# Patient Record
Sex: Male | Born: 1965 | Race: White | Hispanic: No | Marital: Married | Smoking: Current every day smoker
Health system: Southern US, Community
[De-identification: ages and names within clinical notes are randomized; demographics above are authoritative.]

## PROBLEM LIST (undated history)

## (undated) DIAGNOSIS — K759 Inflammatory liver disease, unspecified: Secondary | ICD-10-CM

## (undated) DIAGNOSIS — K219 Gastro-esophageal reflux disease without esophagitis: Secondary | ICD-10-CM

## (undated) DIAGNOSIS — K469 Unspecified abdominal hernia without obstruction or gangrene: Secondary | ICD-10-CM

## (undated) DIAGNOSIS — K4090 Unilateral inguinal hernia, without obstruction or gangrene, not specified as recurrent: Secondary | ICD-10-CM

## (undated) DIAGNOSIS — C801 Malignant (primary) neoplasm, unspecified: Secondary | ICD-10-CM

## (undated) DIAGNOSIS — I10 Essential (primary) hypertension: Secondary | ICD-10-CM

## (undated) HISTORY — PX: NEPHRECTOMY: SHX65

## (undated) HISTORY — PX: CHOLECYSTECTOMY: SHX55

## (undated) HISTORY — PX: HERNIA REPAIR: SHX51

---

## 1998-11-20 ENCOUNTER — Emergency Department (HOSPITAL_COMMUNITY): Admission: EM | Admit: 1998-11-20 | Discharge: 1998-11-20 | Payer: Self-pay | Admitting: Emergency Medicine

## 2016-12-27 ENCOUNTER — Ambulatory Visit: Payer: Self-pay | Admitting: Surgery

## 2016-12-27 NOTE — H&P (Signed)
Theodore Phelps is an 51 y.o. male.   Chief Complaint: left inguinal hernia  HPI: Pt presents for repair of a large left inguinoscrotal hernia.  He has had this for 6 months.  It pops in and out and has been getting larger and causing pain in his left groin.   It is more painful when he lifts.  No change in bowel or bladder function.    No past medical history on file.  No past surgical history on file.  No family history on file. Social History:  has no tobacco, alcohol, and drug history on file.  Allergies: Allergies not on file   (Not in a hospital admission)  No results found for this or any previous visit (from the past 48 hour(s)). No results found.  Review of Systems  Constitutional: Negative for chills and fever.  HENT: Negative for hearing loss and tinnitus.   Eyes: Negative for blurred vision and double vision.  Respiratory: Positive for cough and wheezing.   Cardiovascular: Negative for chest pain.  Gastrointestinal: Positive for abdominal pain.  Genitourinary: Negative for dysuria and urgency.  Musculoskeletal: Negative for myalgias and neck pain.  Skin: Negative for itching and rash.  Neurological: Negative for dizziness and headaches.  Endo/Heme/Allergies: Negative for environmental allergies. Does not bruise/bleed easily.  Psychiatric/Behavioral: Negative for depression.    There were no vitals taken for this visit. Physical Exam  Constitutional: He is oriented to person, place, and time. He appears well-developed and well-nourished.  HENT:  Head: Normocephalic and atraumatic.  Eyes: Pupils are equal, round, and reactive to light. No scleral icterus.  Neck: Normal range of motion. Neck supple.  Cardiovascular: Normal rate and regular rhythm.   Respiratory: Effort normal and breath sounds normal. He has no wheezes.  GI: Soft. Bowel sounds are normal.  Genitourinary:  Genitourinary Comments: Large reducible left inguinoscrotal hernia No RIH  Testes small    Musculoskeletal: Normal range of motion.  Neurological: He is alert and oriented to person, place, and time.  Skin: Skin is warm and dry.  Psychiatric: He has a normal mood and affect. His behavior is normal.     Assessment/Plan Large left inguinoscrotal hernia   Tobacco abuse       Pt desires repair  Laparoscopic and open  Procedure discussed   Use of mesh discussed      The risk of hernia repair include bleeding,  Infection,   Recurrence of the hernia,  Mesh use, chronic pain,  Organ injury,  Bowel injury,  Bladder injury,   nerve injury with numbness around the incision,  Death,  and worsening of preexisting  medical problems.  The alternatives to surgery have been discussed as well..  Long term expectations of both operative and non operative treatments have been discussed.   The patient agrees to proceed.  Theodore Phelps A., MD 12/27/2016, 9:26 AM

## 2017-01-14 ENCOUNTER — Encounter (HOSPITAL_COMMUNITY): Payer: Self-pay

## 2017-01-14 ENCOUNTER — Encounter (HOSPITAL_COMMUNITY)
Admission: RE | Admit: 2017-01-14 | Discharge: 2017-01-14 | Disposition: A | Discharge status: Home / Self Care | Payer: PRIVATE HEALTH INSURANCE | Source: Ambulatory Visit | Attending: Surgery | Admitting: Surgery

## 2017-01-14 DIAGNOSIS — Z85528 Personal history of other malignant neoplasm of kidney: Secondary | ICD-10-CM | POA: Diagnosis not present

## 2017-01-14 DIAGNOSIS — Z01810 Encounter for preprocedural cardiovascular examination: Secondary | ICD-10-CM | POA: Insufficient documentation

## 2017-01-14 DIAGNOSIS — K469 Unspecified abdominal hernia without obstruction or gangrene: Secondary | ICD-10-CM | POA: Insufficient documentation

## 2017-01-14 DIAGNOSIS — K4090 Unilateral inguinal hernia, without obstruction or gangrene, not specified as recurrent: Secondary | ICD-10-CM | POA: Insufficient documentation

## 2017-01-14 DIAGNOSIS — I10 Essential (primary) hypertension: Secondary | ICD-10-CM | POA: Diagnosis not present

## 2017-01-14 DIAGNOSIS — Z01812 Encounter for preprocedural laboratory examination: Secondary | ICD-10-CM | POA: Diagnosis present

## 2017-01-14 DIAGNOSIS — I4510 Unspecified right bundle-branch block: Secondary | ICD-10-CM | POA: Insufficient documentation

## 2017-01-14 HISTORY — DX: Unilateral inguinal hernia, without obstruction or gangrene, not specified as recurrent: K40.90

## 2017-01-14 HISTORY — DX: Essential (primary) hypertension: I10

## 2017-01-14 HISTORY — DX: Unspecified abdominal hernia without obstruction or gangrene: K46.9

## 2017-01-14 HISTORY — DX: Malignant (primary) neoplasm, unspecified: C80.1

## 2017-01-14 HISTORY — DX: Gastro-esophageal reflux disease without esophagitis: K21.9

## 2017-01-14 HISTORY — DX: Inflammatory liver disease, unspecified: K75.9

## 2017-01-14 LAB — COMPREHENSIVE METABOLIC PANEL
ALBUMIN: 3.7 g/dL (ref 3.5–5.0)
ALK PHOS: 65 U/L (ref 38–126)
ALT: 22 U/L (ref 17–63)
AST: 20 U/L (ref 15–41)
Anion gap: 8 (ref 5–15)
BUN: 17 mg/dL (ref 6–20)
CHLORIDE: 108 mmol/L (ref 101–111)
CO2: 22 mmol/L (ref 22–32)
CREATININE: 1.09 mg/dL (ref 0.61–1.24)
Calcium: 9.5 mg/dL (ref 8.9–10.3)
GFR calc Af Amer: >60 mL/min (ref >60)
GFR calc non Af Amer: >60 mL/min (ref >60)
GLUCOSE: 92 mg/dL (ref 65–99)
Potassium: 4.7 mmol/L (ref 3.5–5.1)
SODIUM: 138 mmol/L (ref 135–145)
TOTAL PROTEIN: 6.9 g/dL (ref 6.5–8.1)
Total Bilirubin: 0.5 mg/dL (ref 0.3–1.2)

## 2017-01-14 LAB — CBC WITH DIFFERENTIAL/PLATELET
BASOS ABS: 0 10*3/uL (ref 0.0–0.1)
Basophils Relative: 0 %
EOS ABS: 0.1 10*3/uL (ref 0.0–0.7)
EOS PCT: 1 %
HCT: 46.6 % (ref 39.0–52.0)
HEMOGLOBIN: 15.2 g/dL (ref 13.0–17.0)
Lymphocytes Relative: 31 %
Lymphs Abs: 3.3 10*3/uL (ref 0.7–4.0)
MCH: 28.7 pg (ref 26.0–34.0)
MCHC: 32.6 g/dL (ref 30.0–36.0)
MCV: 88.1 fL (ref 78.0–100.0)
Monocytes Absolute: 0.6 10*3/uL (ref 0.1–1.0)
Monocytes Relative: 5 %
NEUTROS PCT: 63 %
Neutro Abs: 6.6 10*3/uL (ref 1.7–7.7)
PLATELETS: 291 10*3/uL (ref 150–400)
RBC: 5.29 MIL/uL (ref 4.22–5.81)
RDW: 15.2 % (ref 11.5–15.5)
WBC: 10.7 10*3/uL — AB (ref 4.0–10.5)

## 2017-01-14 NOTE — Progress Notes (Signed)
PCP - Dr. Jeanie Sewer- [place]  Cardiologist - Denies  Chest x-ray - Denies  EKG - 01/14/17  Stress Test - Requested  ECHO - Denies  Cardiac Cath - Denies  Sleep Study - No CPAP - None  Chart will be given for anesthesia review due to cardiac history and EKG.  Pt denies having chest pain, sob, or fever at this time. All instructions explained to the pt, with a verbal understanding of the material. Pt agrees to go over the instructions while at home for a better understanding. The opportunity to ask questions was provided.

## 2017-01-14 NOTE — Pre-Procedure Instructions (Signed)
Theodore Phelps  01/14/2017      [facility] - [place], [place] - [place] [address] Phone: [phone] Fax: [phone]    Your procedure is scheduled on Wednesday, January 19, 2017  Report to [facility] Admitting Entrance "A" at 8:30 A.M.   Call this number if you have problems the morning of surgery:  [phone]   Remember:  Do not eat food or drink liquids after midnight.  Take these medicines the morning of surgery with A SIP OF WATER:  AmLODipine (NORVASC)  As of today, stop taking all Aspirins, Vitamins, Fish oils, and Herbal medications. Also stop all NSAIDS i.e. Advil, Motrin, Aleve, Anaprox, Naproxen, BC and Goody Powders.  Please complete your 8oz of Boost Breeze that was given to you at your preadmission appointment by 6:30am on the day of your surgery.   Do not wear jewelry.  Do not wear lotions, powders, or perfumes, or deodorant.  Do not shave 48 hours prior to surgery.  Men may shave face and neck.  Do not bring valuables to the hospital.  [facility] is not responsible for any belongings or valuables.  Contacts, dentures or bridgework may not be worn into surgery.  Leave your suitcase in the car.  After surgery it may be brought to your room.  For patients admitted to the hospital, discharge time will be determined by your treatment team.  Patients discharged the day of surgery will not be allowed to drive home.   Special instructions:   [facility]- Preparing For Surgery  Before surgery, you can play an important role. Because skin is not sterile, your skin needs to be as free of germs as possible. You can reduce the number of germs on your skin by washing with CHG (chlorahexidine gluconate) Soap before surgery.  CHG is an antiseptic cleaner which kills germs and bonds with the skin to continue killing germs even after washing.  Please do not use if you have an allergy to CHG or antibacterial soaps.  If your skin becomes reddened/irritated stop using the CHG.  Do not shave (including legs and underarms) for at least 48 hours prior to first CHG shower. It is OK to shave your face.  Please follow these instructions carefully.   1. Shower the NIGHT BEFORE SURGERY and the MORNING OF SURGERY with CHG.   2. If you chose to wash your hair, wash your hair first as usual with your normal shampoo.  3. After you shampoo, rinse your hair and body thoroughly to remove the shampoo.  4. Use CHG as you would any other liquid soap. You can apply CHG directly to the skin and wash gently with a scrungie or a clean washcloth.   5. Apply the CHG Soap to your body ONLY FROM THE NECK DOWN.  Do not use on open wounds or open sores. Avoid contact with your eyes, ears, mouth and genitals (private parts). Wash genitals (private parts) with your normal soap.  6. Wash thoroughly, paying special attention to the area where your surgery will be performed.  7. Thoroughly rinse your body with warm water from the neck down.  8. DO NOT shower/wash with your normal soap after using and rinsing off the CHG Soap.  9. Pat yourself dry with a CLEAN TOWEL.   10. Wear CLEAN PAJAMAS   11. Place CLEAN SHEETS on your bed the night of your first shower and DO NOT SLEEP  WITH PETS.  Day of Surgery: Do not apply any deodorants/lotions. Please wear clean clothes to the hospital/surgery center.    Please read over the following fact sheets that you were given. Pain Booklet, Coughing and Deep Breathing and Surgical Site Infection Prevention

## 2017-01-17 NOTE — Progress Notes (Addendum)
Anesthesia Chart Review:  Pt is a 51 year old male scheduled for L inguinal hernia repair, insertion of mesh on 01/19/2017 with Erroll Luna, MD  - PCP is Jeanie Sewer, NP  PMH includes:  HTN, renal cancer (s/p L nephrectomy), hepatitis C, GERD. Current smoker. Opium and crack cocaine use. BMI 29  Medications include: amlodipine  Preoperative labs reviewed.    BP (!) 135/95   Pulse 85   Temp 36.8 C   Resp 20   Ht 5\' 8"  (1.727 m)   Wt 188 lb 14.4 oz (85.7 kg)   SpO2 97%   BMI 28.72 kg/m   EKG 01/14/17: NSR. RBBB.   Stress echo 07/28/13 [facility]): 1. No ECG or echocardiographic evidence of inducible ischemia to achieved workload (7.0 METS)  If no changes, I anticipate pt can proceed with surgery as scheduled.   Willeen Cass, FNP-BC [facility] Short Stay Surgical Center/Anesthesiology Phone: [phone] 01/17/2017 10:15 AM

## 2017-01-18 MED ORDER — CEFAZOLIN SODIUM-DEXTROSE 2-4 GM/100ML-% IV SOLN
2.0000 g | INTRAVENOUS | Status: AC
  Administered 2017-01-19: 2 g via INTRAVENOUS
  Filled 2017-01-18: qty 100

## 2017-01-19 ENCOUNTER — Encounter (HOSPITAL_COMMUNITY)
Admission: RE | Disposition: A | Discharge status: Home / Self Care | Payer: Self-pay | Source: Ambulatory Visit | Attending: Surgery

## 2017-01-19 ENCOUNTER — Ambulatory Visit (HOSPITAL_COMMUNITY): Payer: PRIVATE HEALTH INSURANCE | Admitting: Emergency Medicine

## 2017-01-19 ENCOUNTER — Ambulatory Visit (HOSPITAL_COMMUNITY)
Admission: RE | Admit: 2017-01-19 | Discharge: 2017-01-19 | Disposition: A | Discharge status: Home / Self Care | Payer: PRIVATE HEALTH INSURANCE | Source: Ambulatory Visit | Attending: Surgery | Admitting: Surgery

## 2017-01-19 ENCOUNTER — Encounter (HOSPITAL_COMMUNITY): Payer: Self-pay | Admitting: Certified Registered Nurse Anesthetist

## 2017-01-19 ENCOUNTER — Ambulatory Visit (HOSPITAL_COMMUNITY): Payer: PRIVATE HEALTH INSURANCE | Admitting: Certified Registered Nurse Anesthetist

## 2017-01-19 DIAGNOSIS — K4090 Unilateral inguinal hernia, without obstruction or gangrene, not specified as recurrent: Secondary | ICD-10-CM | POA: Insufficient documentation

## 2017-01-19 DIAGNOSIS — B1920 Unspecified viral hepatitis C without hepatic coma: Secondary | ICD-10-CM | POA: Diagnosis not present

## 2017-01-19 DIAGNOSIS — F17200 Nicotine dependence, unspecified, uncomplicated: Secondary | ICD-10-CM | POA: Insufficient documentation

## 2017-01-19 DIAGNOSIS — K219 Gastro-esophageal reflux disease without esophagitis: Secondary | ICD-10-CM | POA: Insufficient documentation

## 2017-01-19 DIAGNOSIS — Z79899 Other long term (current) drug therapy: Secondary | ICD-10-CM | POA: Diagnosis not present

## 2017-01-19 DIAGNOSIS — I10 Essential (primary) hypertension: Secondary | ICD-10-CM | POA: Diagnosis not present

## 2017-01-19 HISTORY — PX: INGUINAL HERNIA REPAIR: SHX194

## 2017-01-19 HISTORY — PX: INSERTION OF MESH: SHX5868

## 2017-01-19 SURGERY — REPAIR, HERNIA, INGUINAL, ADULT
Anesthesia: General | Site: Groin | Laterality: Left

## 2017-01-19 MED ORDER — LACTATED RINGERS IV SOLN: INTRAVENOUS | Status: DC

## 2017-01-19 MED ORDER — PROPOFOL 10 MG/ML IV BOLUS
INTRAVENOUS | Status: DC | PRN
  Administered 2017-01-19: 200 mg via INTRAVENOUS

## 2017-01-19 MED ORDER — FENTANYL CITRATE (PF) 100 MCG/2ML IJ SOLN
INTRAMUSCULAR | Status: AC
  Administered 2017-01-19: 50 ug via INTRAVENOUS
  Filled 2017-01-19: qty 2

## 2017-01-19 MED ORDER — EPHEDRINE 5 MG/ML INJ
INTRAVENOUS | Status: AC
  Filled 2017-01-19: qty 10

## 2017-01-19 MED ORDER — FENTANYL CITRATE (PF) 250 MCG/5ML IJ SOLN
INTRAMUSCULAR | Status: AC
  Filled 2017-01-19: qty 5

## 2017-01-19 MED ORDER — OXYCODONE HCL 5 MG PO TABS
10.0000 mg | ORAL_TABLET | Freq: Once | ORAL | Status: AC
  Administered 2017-01-19: 10 mg via ORAL

## 2017-01-19 MED ORDER — HYDROMORPHONE HCL 1 MG/ML IJ SOLN
INTRAMUSCULAR | Status: AC
  Administered 2017-01-19: 0.5 mg via INTRAVENOUS
  Filled 2017-01-19: qty 1

## 2017-01-19 MED ORDER — BUPIVACAINE-EPINEPHRINE (PF) 0.5% -1:200000 IJ SOLN
INTRAMUSCULAR | Status: DC | PRN
  Administered 2017-01-19: 30 mL

## 2017-01-19 MED ORDER — MEPERIDINE HCL 25 MG/ML IJ SOLN: 6.2500 mg | INTRAMUSCULAR | Status: DC | PRN

## 2017-01-19 MED ORDER — EPHEDRINE SULFATE 50 MG/ML IJ SOLN
INTRAMUSCULAR | Status: DC | PRN
  Administered 2017-01-19: 20 mg via INTRAVENOUS

## 2017-01-19 MED ORDER — PHENYLEPHRINE 40 MCG/ML (10ML) SYRINGE FOR IV PUSH (FOR BLOOD PRESSURE SUPPORT)
PREFILLED_SYRINGE | INTRAVENOUS | Status: DC | PRN
  Administered 2017-01-19: 160 ug via INTRAVENOUS
  Administered 2017-01-19: 80 ug via INTRAVENOUS

## 2017-01-19 MED ORDER — 0.9 % SODIUM CHLORIDE (POUR BTL) OPTIME
TOPICAL | Status: DC | PRN
  Administered 2017-01-19: 1000 mL

## 2017-01-19 MED ORDER — OXYCODONE HCL 5 MG PO TABS: 5.0000 mg | ORAL_TABLET | Freq: Four times a day (QID) | ORAL | 0 refills | Status: DC | PRN

## 2017-01-19 MED ORDER — LACTATED RINGERS IV SOLN
INTRAVENOUS | Status: DC
Start: 2017-01-19 — End: 2017-01-19
  Administered 2017-01-19 (×2): via INTRAVENOUS

## 2017-01-19 MED ORDER — OXYCODONE HCL 5 MG PO TABS
ORAL_TABLET | ORAL | Status: AC
  Administered 2017-01-19: 10 mg via ORAL
  Filled 2017-01-19: qty 2

## 2017-01-19 MED ORDER — MIDAZOLAM HCL 2 MG/2ML IJ SOLN
2.0000 mg | Freq: Once | INTRAMUSCULAR | Status: AC
  Administered 2017-01-19: 2 mg via INTRAVENOUS

## 2017-01-19 MED ORDER — ACETAMINOPHEN 500 MG PO TABS
ORAL_TABLET | ORAL | Status: AC
  Filled 2017-01-19: qty 2

## 2017-01-19 MED ORDER — ACETAMINOPHEN 500 MG PO TABS
1000.0000 mg | ORAL_TABLET | ORAL | Status: AC
  Administered 2017-01-19: 1000 mg via ORAL

## 2017-01-19 MED ORDER — FENTANYL CITRATE (PF) 100 MCG/2ML IJ SOLN
INTRAMUSCULAR | Status: DC | PRN
  Administered 2017-01-19 (×2): 50 ug via INTRAVENOUS
  Administered 2017-01-19: 100 ug via INTRAVENOUS

## 2017-01-19 MED ORDER — FENTANYL CITRATE (PF) 100 MCG/2ML IJ SOLN
50.0000 ug | Freq: Once | INTRAMUSCULAR | Status: AC
  Administered 2017-01-19: 50 ug via INTRAVENOUS

## 2017-01-19 MED ORDER — GABAPENTIN 300 MG PO CAPS
ORAL_CAPSULE | ORAL | Status: AC
  Filled 2017-01-19: qty 1

## 2017-01-19 MED ORDER — ROCURONIUM BROMIDE 100 MG/10ML IV SOLN
INTRAVENOUS | Status: DC | PRN
  Administered 2017-01-19: 10 mg via INTRAVENOUS
  Administered 2017-01-19: 40 mg via INTRAVENOUS

## 2017-01-19 MED ORDER — ONDANSETRON HCL 4 MG/2ML IJ SOLN
INTRAMUSCULAR | Status: AC
  Filled 2017-01-19: qty 2

## 2017-01-19 MED ORDER — ONDANSETRON HCL 4 MG/2ML IJ SOLN
INTRAMUSCULAR | Status: DC | PRN
  Administered 2017-01-19: 4 mg via INTRAVENOUS

## 2017-01-19 MED ORDER — CELECOXIB 200 MG PO CAPS
ORAL_CAPSULE | ORAL | Status: AC
  Filled 2017-01-19: qty 1

## 2017-01-19 MED ORDER — HYDROMORPHONE HCL 1 MG/ML IJ SOLN
0.2500 mg | INTRAMUSCULAR | Status: DC | PRN
  Administered 2017-01-19 (×3): 0.5 mg via INTRAVENOUS

## 2017-01-19 MED ORDER — PROPOFOL 10 MG/ML IV BOLUS
INTRAVENOUS | Status: AC
  Filled 2017-01-19: qty 20

## 2017-01-19 MED ORDER — ETOMIDATE 2 MG/ML IV SOLN
INTRAVENOUS | Status: AC
  Filled 2017-01-19: qty 10

## 2017-01-19 MED ORDER — DEXAMETHASONE SODIUM PHOSPHATE 10 MG/ML IJ SOLN
INTRAMUSCULAR | Status: DC | PRN
  Administered 2017-01-19: 10 mg via INTRAVENOUS

## 2017-01-19 MED ORDER — IBUPROFEN 800 MG PO TABS: 800.0000 mg | ORAL_TABLET | Freq: Three times a day (TID) | ORAL | 0 refills | Status: DC | PRN

## 2017-01-19 MED ORDER — PHENYLEPHRINE HCL 10 MG/ML IJ SOLN
INTRAVENOUS | Status: DC | PRN
  Administered 2017-01-19: 40 ug/min via INTRAVENOUS

## 2017-01-19 MED ORDER — CELECOXIB 200 MG PO CAPS
400.0000 mg | ORAL_CAPSULE | ORAL | Status: AC
  Administered 2017-01-19: 400 mg via ORAL

## 2017-01-19 MED ORDER — CHLORHEXIDINE GLUCONATE CLOTH 2 % EX PADS: 6.0000 | MEDICATED_PAD | Freq: Once | CUTANEOUS | Status: DC

## 2017-01-19 MED ORDER — SUCCINYLCHOLINE CHLORIDE 200 MG/10ML IV SOSY
PREFILLED_SYRINGE | INTRAVENOUS | Status: AC
  Filled 2017-01-19: qty 10

## 2017-01-19 MED ORDER — MIDAZOLAM HCL 2 MG/2ML IJ SOLN
INTRAMUSCULAR | Status: AC
  Administered 2017-01-19: 2 mg via INTRAVENOUS
  Filled 2017-01-19: qty 2

## 2017-01-19 MED ORDER — MIDAZOLAM HCL 2 MG/2ML IJ SOLN
INTRAMUSCULAR | Status: AC
  Filled 2017-01-19: qty 2

## 2017-01-19 MED ORDER — GABAPENTIN 300 MG PO CAPS
300.0000 mg | ORAL_CAPSULE | ORAL | Status: AC
  Administered 2017-01-19: 300 mg via ORAL

## 2017-01-19 MED ORDER — SUGAMMADEX SODIUM 200 MG/2ML IV SOLN
INTRAVENOUS | Status: DC | PRN
  Administered 2017-01-19: 200 mg via INTRAVENOUS

## 2017-01-19 MED ORDER — BUPIVACAINE-EPINEPHRINE 0.25% -1:200000 IJ SOLN
INTRAMUSCULAR | Status: DC | PRN
Start: 2017-01-19 — End: 2017-01-19
  Administered 2017-01-19: 10 mL

## 2017-01-19 MED ORDER — PROMETHAZINE HCL 25 MG/ML IJ SOLN: 6.2500 mg | INTRAMUSCULAR | Status: DC | PRN

## 2017-01-19 MED ORDER — LIDOCAINE HCL (CARDIAC) 20 MG/ML IV SOLN
INTRAVENOUS | Status: DC | PRN
  Administered 2017-01-19: 60 mg via INTRAVENOUS

## 2017-01-19 MED ORDER — BUPIVACAINE-EPINEPHRINE (PF) 0.25% -1:200000 IJ SOLN
INTRAMUSCULAR | Status: AC
  Filled 2017-01-19: qty 30

## 2017-01-19 SURGICAL SUPPLY — 47 items
ADH SKN CLS APL DERMABOND .7 (GAUZE/BANDAGES/DRESSINGS) ×1
BLADE CLIPPER SURG (BLADE) IMPLANT
CANISTER SUCT 3000ML PPV (MISCELLANEOUS) IMPLANT
CHLORAPREP W/TINT 26ML (MISCELLANEOUS) ×3 IMPLANT
COVER SURGICAL LIGHT HANDLE (MISCELLANEOUS) ×3 IMPLANT
DERMABOND ADVANCED (GAUZE/BANDAGES/DRESSINGS) ×2
DERMABOND ADVANCED .7 DNX12 (GAUZE/BANDAGES/DRESSINGS) ×1 IMPLANT
DRAIN PENROSE 1/2X12 LTX STRL (WOUND CARE) IMPLANT
DRAPE LAPAROTOMY TRNSV 102X78 (DRAPE) ×3 IMPLANT
DRAPE UTILITY XL STRL (DRAPES) ×6 IMPLANT
ELECT CAUTERY BLADE 6.4 (BLADE) ×3 IMPLANT
ELECT REM PT RETURN 9FT ADLT (ELECTROSURGICAL) ×3
ELECTRODE REM PT RTRN 9FT ADLT (ELECTROSURGICAL) ×1 IMPLANT
GLOVE BIO SURGEON STRL SZ8 (GLOVE) ×3 IMPLANT
GLOVE BIOGEL PI IND STRL 8 (GLOVE) ×1 IMPLANT
GLOVE BIOGEL PI INDICATOR 8 (GLOVE) ×2
GOWN STRL REUS W/ TWL LRG LVL3 (GOWN DISPOSABLE) ×1 IMPLANT
GOWN STRL REUS W/ TWL XL LVL3 (GOWN DISPOSABLE) ×1 IMPLANT
GOWN STRL REUS W/TWL LRG LVL3 (GOWN DISPOSABLE) ×3
GOWN STRL REUS W/TWL XL LVL3 (GOWN DISPOSABLE) ×3
KIT BASIN OR (CUSTOM PROCEDURE TRAY) ×3 IMPLANT
KIT ROOM TURNOVER OR (KITS) ×3 IMPLANT
MESH HERNIA SYS ULTRAPRO LRG (Mesh General) ×2 IMPLANT
NDL HYPO 25GX1X1/2 BEV (NEEDLE) ×1 IMPLANT
NEEDLE HYPO 25GX1X1/2 BEV (NEEDLE) ×3 IMPLANT
NS IRRIG 1000ML POUR BTL (IV SOLUTION) ×3 IMPLANT
PACK SURGICAL SETUP 50X90 (CUSTOM PROCEDURE TRAY) ×3 IMPLANT
PAD ARMBOARD 7.5X6 YLW CONV (MISCELLANEOUS) ×3 IMPLANT
PENCIL BUTTON HOLSTER BLD 10FT (ELECTRODE) ×3 IMPLANT
SPONGE LAP 18X18 X RAY DECT (DISPOSABLE) ×3 IMPLANT
SUT MNCRL AB 4-0 PS2 18 (SUTURE) ×3 IMPLANT
SUT NOVA NAB DX-16 0-1 5-0 T12 (SUTURE) ×7 IMPLANT
SUT NOVA NAB GS-21 0 18 T12 DT (SUTURE) ×2 IMPLANT
SUT SILK 2 0 SH (SUTURE) IMPLANT
SUT VIC AB 0 CT1 27 (SUTURE)
SUT VIC AB 0 CT1 27XBRD ANBCTR (SUTURE) IMPLANT
SUT VIC AB 2-0 SH 27 (SUTURE) ×3
SUT VIC AB 2-0 SH 27X BRD (SUTURE) ×1 IMPLANT
SUT VIC AB 3-0 SH 18 (SUTURE) ×3 IMPLANT
SUT VICRYL AB 3 0 TIES (SUTURE) ×3 IMPLANT
SYR BULB 3OZ (MISCELLANEOUS) IMPLANT
SYR CONTROL 10ML LL (SYRINGE) ×3 IMPLANT
TOWEL OR 17X24 6PK STRL BLUE (TOWEL DISPOSABLE) ×3 IMPLANT
TOWEL OR 17X26 10 PK STRL BLUE (TOWEL DISPOSABLE) ×3 IMPLANT
TUBE CONNECTING 12'X1/4 (SUCTIONS)
TUBE CONNECTING 12X1/4 (SUCTIONS) IMPLANT
YANKAUER SUCT BULB TIP NO VENT (SUCTIONS) IMPLANT

## 2017-01-19 NOTE — Op Note (Signed)
Hernia, Open, Procedure Note  Indications: The patient presented with a history of a left, reducible  Inguinal hernia.  The patient desired repair.  The risk of hernia repair include bleeding,  Infection,   Recurrence of the hernia,  Mesh use, chronic pain,  Organ injury,  Bowel injury,  Bladder injury,   nerve injury with numbness around the incision,  Death,  and worsening of preexisting  medical problems.  The alternatives to surgery have been discussed as well..  Long term expectations of both operative and non operative treatments have been discussed.   The patient agrees to proceed.  Pre-operative Diagnosis: left reducible inguinal hernia   Post-operative Diagnosis: same  Surgeon: Erroll Luna A.   Assistants: OR staff  Anesthesia: General endotracheal anesthesia, Local anesthesia 0.25.% bupivacaine, with epinephrine and TEP block   ASA Class: 2  Procedure Details  The patient was seen again in the Holding Room. The risks, benefits, complications, treatment options, and expected outcomes were discussed with the patient. The possibilities of reaction to medication, pulmonary aspiration, perforation of viscus, bleeding, recurrent infection, the need for additional procedures, and development of a complication requiring transfusion or further operation were discussed with the patient and/or family. There was concurrence with the proposed plan, and informed consent was obtained. The site of surgery was properly noted/marked. The patient was taken to the Operating Room, identified as Theodore Phelps, and the procedure verified as hernia repair. A Time Out was held and the above information confirmed.  The patient was placed in the supine position and underwent induction of anesthesia, the lower abdomen and groin was prepped and draped in the standard fashion, and 0.25% Marcaine with epinephrine was used to anesthetize the skin over the mid-portion of the inguinal canal. A transverse incision was  made. Dissection was carried through the soft tissue to expose the inguinal canal and inguinal ligament along its lower edge. The external oblique fascia was split along the course of its fibers, exposing the inguinal canal. The cord and nerve were looped using a Penrose drain and reflected out of the field.  The patient had a large indirect defect into his scrotum containing the sigmoid colon.  The was carefully reduced.  The sac was opened and adhesions of the colon were taken down under direct vision and reduced without complication.  The sac was closed with 3 0 vicryl.  The defect was exposed and a piece of prolene hernia system ultrapro mesh was and placed into  the defectand deployed.  Interupted 0 and  2-0 novafil suture was then used  to repair the defect, with the suture being sewn from the pubic tubercle inferiorly and superiorly along the canal to a level just beyond the internal ring. The mesh was split to allow passage of the cord and  into the canal without entrapment. The ilioinguinal and iliohypogastric nerves were divided to prevent entrapment and post op pain.   The contents were then returned to canal and the external oblique fashion was then closed in a continuous fashion using 2-0 Vicryl suture taking care not to cause entrapment. Scarpa's layer closed with 3 0 vicryl and 4 0 monocryl used to close the skin.  Dermabond used for dressing.  Instrument, sponge, and needle counts were correct prior to closure and at the conclusion of the case.  Findings: Hernia as above  Estimated Blood Loss: Minimal         Drains: None         Total IV Fluids: per  anesthesia          Specimens: none                Complications: None; patient tolerated the procedure well.         Disposition: PACU - hemodynamically stable.         Condition: stable

## 2017-01-19 NOTE — Anesthesia Procedure Notes (Signed)
Anesthesia Regional Block: TAP block   Pre-Anesthetic Checklist: ,, timeout performed, Correct Patient, Correct Site, Correct Laterality, Correct Procedure, Correct Position, site marked, Risks and benefits discussed,  Surgical consent,  Pre-op evaluation,  At surgeon's request and post-op pain management  Laterality: Left  Prep: chloraprep       Needles:  Injection technique: Single-shot  Needle Type: Echogenic Needle     Needle Length: 9cm  Needle Gauge: 21     Additional Needles:   Procedures:,,,, ultrasound used (permanent image in chart),,,,  Narrative:  Start time: 01/19/2017 9:50 AM End time: 01/19/2017 10:00 AM Injection made incrementally with aspirations every 5 mL.  Performed by: Personally  Anesthesiologist: Suella Broad D  Additional Notes: Patient tolerated the procedure well. Local anesthetic introduced in an incremental fashion under minimal resistance after negative aspirations. No paresthesias were elicited. After completion of the procedure, no acute issues were identified and patient continued to be monitored by RN.

## 2017-01-19 NOTE — H&P (View-Only) (Signed)
Theodore Phelps is an 51 y.o. male.   Chief Complaint: left inguinal hernia  HPI: Pt presents for repair of a large left inguinoscrotal hernia.  He has had this for 6 months.  It pops in and out and has been getting larger and causing pain in his left groin.   It is more painful when he lifts.  No change in bowel or bladder function.    No past medical history on file.  No past surgical history on file.  No family history on file. Social History:  has no tobacco, alcohol, and drug history on file.  Allergies: Allergies not on file   (Not in a hospital admission)  No results found for this or any previous visit (from the past 48 hour(s)). No results found.  Review of Systems  Constitutional: Negative for chills and fever.  HENT: Negative for hearing loss and tinnitus.   Eyes: Negative for blurred vision and double vision.  Respiratory: Positive for cough and wheezing.   Cardiovascular: Negative for chest pain.  Gastrointestinal: Positive for abdominal pain.  Genitourinary: Negative for dysuria and urgency.  Musculoskeletal: Negative for myalgias and neck pain.  Skin: Negative for itching and rash.  Neurological: Negative for dizziness and headaches.  Endo/Heme/Allergies: Negative for environmental allergies. Does not bruise/bleed easily.  Psychiatric/Behavioral: Negative for depression.    There were no vitals taken for this visit. Physical Exam  Constitutional: He is oriented to person, place, and time. He appears well-developed and well-nourished.  HENT:  Head: Normocephalic and atraumatic.  Eyes: Pupils are equal, round, and reactive to light. No scleral icterus.  Neck: Normal range of motion. Neck supple.  Cardiovascular: Normal rate and regular rhythm.   Respiratory: Effort normal and breath sounds normal. He has no wheezes.  GI: Soft. Bowel sounds are normal.  Genitourinary:  Genitourinary Comments: Large reducible left inguinoscrotal hernia No RIH  Testes small    Musculoskeletal: Normal range of motion.  Neurological: He is alert and oriented to person, place, and time.  Skin: Skin is warm and dry.  Psychiatric: He has a normal mood and affect. His behavior is normal.     Assessment/Plan Large left inguinoscrotal hernia   Tobacco abuse       Pt desires repair  Laparoscopic and open  Procedure discussed   Use of mesh discussed      The risk of hernia repair include bleeding,  Infection,   Recurrence of the hernia,  Mesh use, chronic pain,  Organ injury,  Bowel injury,  Bladder injury,   nerve injury with numbness around the incision,  Death,  and worsening of preexisting  medical problems.  The alternatives to surgery have been discussed as well..  Long term expectations of both operative and non operative treatments have been discussed.   The patient agrees to proceed.  Jasa Dundon A., MD 12/27/2016, 9:26 AM

## 2017-01-19 NOTE — Interval H&P Note (Signed)
History and Physical Interval Note:  01/19/2017 8:04 AM  Theodore Phelps  has presented today for surgery, with the diagnosis of LEFT INGUINAL HERNIA   The various methods of treatment have been discussed with the patient and family. After consideration of risks, benefits and other options for treatment, the patient has consented to  Procedure(s): LEFT HERNIA REPAIR INGUINAL ADULT (Left) INSERTION OF MESH (Left) as a surgical intervention .  The patient's history has been reviewed, patient examined, no change in status, stable for surgery.  I have reviewed the patient's chart and labs.  Questions were answered to the patient's satisfaction.     Rodger Giangregorio A.

## 2017-01-19 NOTE — Anesthesia Preprocedure Evaluation (Addendum)
Anesthesia Evaluation  Patient identified by MRN, date of birth, ID band Patient awake    Reviewed: Allergy & Precautions, NPO status , Patient's Chart, lab work & pertinent test results  History of Anesthesia Complications (+) PONV  Airway Mallampati: II  TM Distance: >3 FB Neck ROM: Full    Dental  (+) Teeth Intact, Dental Advisory Given, Poor Dentition   Pulmonary Current Smoker,    breath sounds clear to auscultation       Cardiovascular hypertension, Pt. on medications  Rhythm:Regular Rate:Normal     Neuro/Psych negative neurological ROS     GI/Hepatic GERD  Controlled,(+) Hepatitis -, C  Endo/Other  negative endocrine ROS  Renal/GU negative Renal ROS     Musculoskeletal negative musculoskeletal ROS (+)   Abdominal   Peds  Hematology negative hematology ROS (+)   Anesthesia Other Findings Day of surgery medications reviewed with the patient.  Reproductive/Obstetrics                           Lab Results  Component Value Date   WBC 10.7 (H) 01/14/2017   HGB 15.2 01/14/2017   HCT 46.6 01/14/2017   MCV 88.1 01/14/2017   PLT 291 01/14/2017   Lab Results  Component Value Date   CREATININE 1.09 01/14/2017   BUN 17 01/14/2017   NA 138 01/14/2017   K 4.7 01/14/2017   CL 108 01/14/2017   CO2 22 01/14/2017   No results found for: INR, PROTIME  EKG: normal sinus rhythm, RBBB.  Anesthesia Physical Anesthesia Plan  ASA: II  Anesthesia Plan: General   Post-op Pain Management:  Regional for Post-op pain   Induction: Intravenous  PONV Risk Score and Plan: 3 and Ondansetron, Dexamethasone, Midazolam and Treatment may vary due to age or medical condition  Airway Management Planned: Oral ETT  Additional Equipment:   Intra-op Plan:   Post-operative Plan: Extubation in OR  Informed Consent: I have reviewed the patients History and Physical, chart, labs and discussed the  procedure including the risks, benefits and alternatives for the proposed anesthesia with the patient or authorized representative who has indicated his/her understanding and acceptance.   Dental advisory given  Plan Discussed with: CRNA, Anesthesiologist and Surgeon  Anesthesia Plan Comments:       Anesthesia Quick Evaluation

## 2017-01-19 NOTE — Anesthesia Postprocedure Evaluation (Signed)
Anesthesia Post Note  Patient: Theodore Phelps  Procedure(s) Performed: LEFT HERNIA REPAIR INGUINAL ADULT (Left Groin) INSERTION OF MESH (Left Groin)     Patient location during evaluation: PACU Anesthesia Type: General Level of consciousness: awake and alert Pain management: pain level controlled Vital Signs Assessment: post-procedure vital signs reviewed and stable Respiratory status: spontaneous breathing, nonlabored ventilation, respiratory function stable and patient connected to nasal cannula oxygen Cardiovascular status: blood pressure returned to baseline and stable Postop Assessment: no apparent nausea or vomiting Anesthetic complications: no Comments: [name] endorses no incisional pain.     Last Vitals:  Vitals:   01/19/17 1345 01/19/17 1400  BP: 109/78 120/83  Pulse: 73 86  Resp: 10 16  Temp:  36.5 C  SpO2: 95% 97%    Last Pain:  Vitals:   01/19/17 1400  TempSrc:   PainSc: 2                  Effie Berkshire

## 2017-01-19 NOTE — Discharge Instructions (Signed)
[facility] _______[facility] ° °UMBILICAL OR INGUINAL HERNIA REPAIR: POST OP INSTRUCTIONS ° °Always review your discharge instruction sheet given to you by the facility where your surgery was performed. °IF YOU HAVE DISABILITY OR FAMILY LEAVE FORMS, YOU MUST BRING THEM TO THE OFFICE FOR PROCESSING.   °DO NOT GIVE THEM TO YOUR DOCTOR. ° °1. A  prescription for pain medication may be given to you upon discharge.  Take your pain medication as prescribed, if needed.  If narcotic pain medicine is not needed, then you may take acetaminophen (Tylenol) or ibuprofen (Advil) as needed. °2. Take your usually prescribed medications unless otherwise directed. °If you need a refill on your pain medication, please contact your pharmacy.  They will contact our office to request authorization. Prescriptions will not be filled after 5 pm or on week-ends. °3. You should follow a light diet the first 24 hours after arrival home, such as soup and crackers, etc.  Be sure to include lots of fluids daily.  Resume your normal diet the day after surgery. °4.Most patients will experience some swelling and bruising around the umbilicus or in the groin and scrotum.  Ice packs and reclining will help.  Swelling and bruising can take several days to resolve.  °6. It is common to experience some constipation if taking pain medication after surgery.  Increasing fluid intake and taking a stool softener (such as Colace) will usually help or prevent this problem from occurring.  A mild laxative (Milk of Magnesia or Miralax) should be taken according to package directions if there are no bowel movements after 48 hours. °7. Unless discharge instructions indicate otherwise, you may remove your bandages 24-48 hours after surgery, and you may shower at that time.  You may have steri-strips (small skin tapes) in place directly over the incision.  These strips should be left on the skin for 7-10 days.  If your surgeon used skin glue on the  incision, you may shower in 24 hours.  The glue will flake off over the next 2-3 weeks.  Any sutures or staples will be removed at the office during your follow-up visit. °8. ACTIVITIES:  You may resume regular (light) daily activities beginning the next day--such as daily self-care, walking, climbing stairs--gradually increasing activities as tolerated.  You may have sexual intercourse when it is comfortable.  Refrain from any heavy lifting or straining until approved by your doctor. ° °a.You may drive when you are no longer taking prescription pain medication, you can comfortably wear a seatbelt, and you can safely maneuver your car and apply brakes. °b.RETURN TO WORK:   °_____________________________________________ ° °9.You should see your doctor in the office for a follow-up appointment approximately 2-3 weeks after your surgery.  Make sure that you call for this appointment within a day or two after you arrive home to insure a convenient appointment time. °10.OTHER INSTRUCTIONS: _________________________ °   _____________________________________ ° °WHEN TO CALL YOUR DOCTOR: °1. Fever over 101.0 °2. Inability to urinate °3. Nausea and/or vomiting °4. Extreme swelling or bruising °5. Continued bleeding from incision. °6. Increased pain, redness, or drainage from the incision ° °The clinic staff is available to answer your questions during regular business hours.  Please don’t hesitate to call and ask to speak to one of the nurses for clinical concerns.  If you have a medical emergency, go to the nearest emergency room or call 911.  A surgeon from [facility] is always on call at the hospital ° ° °  [address], [address], [place], [place]  [address] ? ° [address] [address], [place], [place]   [address] °[phone] ? [phone] ? FAX [phone] °Web site: [email or web address] °

## 2017-01-19 NOTE — Anesthesia Procedure Notes (Signed)
Procedure Name: Intubation Date/Time: 01/19/2017 10:48 AM Performed by: Carney Living Pre-anesthesia Checklist: Patient identified, Emergency Drugs available, Suction available, Patient being monitored and Timeout performed Patient Re-evaluated:Patient Re-evaluated prior to induction Oxygen Delivery Method: Circle system utilized Preoxygenation: Pre-oxygenation with 100% oxygen Induction Type: IV induction Ventilation: Mask ventilation without difficulty and Oral airway inserted - appropriate to patient size Laryngoscope Size: Mac and 4 Grade View: Grade II Tube type: Oral Tube size: 7.5 mm Number of attempts: 1 Airway Equipment and Method: Stylet Placement Confirmation: ETT inserted through vocal cords under direct vision,  positive ETCO2 and breath sounds checked- equal and bilateral Secured at: 22 cm Tube secured with: Tape Dental Injury: Teeth and Oropharynx as per pre-operative assessment

## 2017-01-19 NOTE — Transfer of Care (Signed)
Immediate Anesthesia Transfer of Care Note  Patient: Theodore Phelps  Procedure(s) Performed: LEFT HERNIA REPAIR INGUINAL ADULT (Left Groin) INSERTION OF MESH (Left Groin)  Patient Location: PACU  Anesthesia Type:General  Level of Consciousness: awake, alert , oriented and patient cooperative  Airway & Oxygen Therapy: Patient Spontanous Breathing and Patient connected to nasal cannula oxygen  Post-op Assessment: Report given to RN, Post -op Vital signs reviewed and stable and Patient moving all extremities X 4  Post vital signs: Reviewed and stable  Last Vitals:  Vitals:   01/19/17 1030 01/19/17 1230  BP: 113/75   Pulse: 73   Resp: 16   Temp:  (P) 36.5 C  SpO2: 96%     Last Pain:  Vitals:   01/19/17 1005  TempSrc:   PainSc: 1       Patients Stated Pain Goal: 2 (28/63/81 7711)  Complications: No apparent anesthesia complications

## 2017-01-20 ENCOUNTER — Encounter (HOSPITAL_COMMUNITY): Payer: Self-pay | Admitting: Surgery

## 2017-02-07 ENCOUNTER — Encounter (HOSPITAL_COMMUNITY): Payer: Self-pay | Admitting: Surgery

## 2018-02-13 ENCOUNTER — Emergency Department (HOSPITAL_COMMUNITY): Payer: No Typology Code available for payment source

## 2018-02-13 ENCOUNTER — Other Ambulatory Visit: Payer: Self-pay

## 2018-02-13 ENCOUNTER — Emergency Department (HOSPITAL_COMMUNITY)
Admission: EM | Admit: 2018-02-13 | Discharge: 2018-02-13 | Disposition: A | Discharge status: Home / Self Care | Payer: No Typology Code available for payment source | Attending: Emergency Medicine | Admitting: Emergency Medicine

## 2018-02-13 ENCOUNTER — Encounter (HOSPITAL_COMMUNITY): Payer: Self-pay | Admitting: *Deleted

## 2018-02-13 DIAGNOSIS — Z79899 Other long term (current) drug therapy: Secondary | ICD-10-CM | POA: Insufficient documentation

## 2018-02-13 DIAGNOSIS — F1199 Opioid use, unspecified with unspecified opioid-induced disorder: Secondary | ICD-10-CM | POA: Diagnosis present

## 2018-02-13 DIAGNOSIS — I10 Essential (primary) hypertension: Secondary | ICD-10-CM | POA: Diagnosis not present

## 2018-02-13 DIAGNOSIS — Z85528 Personal history of other malignant neoplasm of kidney: Secondary | ICD-10-CM | POA: Diagnosis not present

## 2018-02-13 DIAGNOSIS — F1123 Opioid dependence with withdrawal: Secondary | ICD-10-CM

## 2018-02-13 DIAGNOSIS — R079 Chest pain, unspecified: Secondary | ICD-10-CM

## 2018-02-13 DIAGNOSIS — F1193 Opioid use, unspecified with withdrawal: Secondary | ICD-10-CM

## 2018-02-13 DIAGNOSIS — F17210 Nicotine dependence, cigarettes, uncomplicated: Secondary | ICD-10-CM | POA: Insufficient documentation

## 2018-02-13 LAB — COMPREHENSIVE METABOLIC PANEL
ALBUMIN: 3.6 g/dL (ref 3.5–5.0)
ALK PHOS: 82 U/L (ref 38–126)
ALT: 165 U/L — ABNORMAL HIGH (ref 0–44)
AST: 113 U/L — ABNORMAL HIGH (ref 15–41)
Anion gap: 9 (ref 5–15)
BILIRUBIN TOTAL: 0.9 mg/dL (ref 0.3–1.2)
BUN: 23 mg/dL — AB (ref 6–20)
CHLORIDE: 102 mmol/L (ref 98–111)
CO2: 25 mmol/L (ref 22–32)
Calcium: 9.8 mg/dL (ref 8.9–10.3)
Creatinine, Ser: 1.67 mg/dL — ABNORMAL HIGH (ref 0.61–1.24)
GFR calc Af Amer: 53 mL/min — ABNORMAL LOW (ref >60)
GFR, EST NON AFRICAN AMERICAN: 46 mL/min — AB (ref >60)
Glucose, Bld: 98 mg/dL (ref 70–99)
POTASSIUM: 4.4 mmol/L (ref 3.5–5.1)
SODIUM: 136 mmol/L (ref 135–145)
Total Protein: 7.7 g/dL (ref 6.5–8.1)

## 2018-02-13 LAB — I-STAT TROPONIN, ED
Troponin i, poc: 0 ng/mL (ref 0.00–0.08)
Troponin i, poc: 0 ng/mL (ref 0.00–0.08)

## 2018-02-13 LAB — LIPASE, BLOOD: Lipase: 43 U/L (ref 11–51)

## 2018-02-13 LAB — RAPID URINE DRUG SCREEN, HOSP PERFORMED
Amphetamines: NOT DETECTED
BENZODIAZEPINES: POSITIVE — AB
Barbiturates: NOT DETECTED
COCAINE: NOT DETECTED
Opiates: NOT DETECTED
TETRAHYDROCANNABINOL: NOT DETECTED

## 2018-02-13 LAB — CBC WITH DIFFERENTIAL/PLATELET
ABS IMMATURE GRANULOCYTES: 0.03 10*3/uL (ref 0.00–0.07)
Basophils Absolute: 0.1 10*3/uL (ref 0.0–0.1)
Basophils Relative: 1 %
EOS PCT: 3 %
Eosinophils Absolute: 0.3 10*3/uL (ref 0.0–0.5)
HCT: 49 % (ref 39.0–52.0)
HEMOGLOBIN: 15.5 g/dL (ref 13.0–17.0)
Immature Granulocytes: 0 %
LYMPHS ABS: 3.3 10*3/uL (ref 0.7–4.0)
LYMPHS PCT: 31 %
MCH: 29.2 pg (ref 26.0–34.0)
MCHC: 31.6 g/dL (ref 30.0–36.0)
MCV: 92.5 fL (ref 80.0–100.0)
MONO ABS: 1.1 10*3/uL — AB (ref 0.1–1.0)
Monocytes Relative: 10 %
Neutro Abs: 6 10*3/uL (ref 1.7–7.7)
Neutrophils Relative %: 55 %
Platelets: 279 10*3/uL (ref 150–400)
RBC: 5.3 MIL/uL (ref 4.22–5.81)
RDW: 14.7 % (ref 11.5–15.5)
WBC: 10.8 10*3/uL — ABNORMAL HIGH (ref 4.0–10.5)
nRBC: 0 % (ref 0.0–0.2)

## 2018-02-13 LAB — ETHANOL

## 2018-02-13 LAB — I-STAT CG4 LACTIC ACID, ED
Lactic Acid, Venous: 1.68 mmol/L (ref 0.5–1.9)
Lactic Acid, Venous: 1.27 mmol/L (ref 0.5–1.9)

## 2018-02-13 MED ORDER — CYCLOBENZAPRINE HCL 10 MG PO TABS: 10.0000 mg | ORAL_TABLET | Freq: Two times a day (BID) | ORAL | 0 refills | Status: DC | PRN

## 2018-02-13 MED ORDER — LOPERAMIDE HCL 2 MG PO CAPS: 2.0000 mg | ORAL_CAPSULE | Freq: Four times a day (QID) | ORAL | 0 refills | Status: DC | PRN

## 2018-02-13 MED ORDER — ONDANSETRON 4 MG PO TBDP
4.0000 mg | ORAL_TABLET | Freq: Once | ORAL | Status: AC
  Administered 2018-02-13: 4 mg via ORAL
  Filled 2018-02-13: qty 1

## 2018-02-13 MED ORDER — ONDANSETRON 4 MG PO TBDP: 4.0000 mg | ORAL_TABLET | Freq: Three times a day (TID) | ORAL | 0 refills | Status: DC | PRN

## 2018-02-13 MED ORDER — CYCLOBENZAPRINE HCL 10 MG PO TABS
10.0000 mg | ORAL_TABLET | Freq: Once | ORAL | Status: AC
  Administered 2018-02-13: 10 mg via ORAL
  Filled 2018-02-13: qty 1

## 2018-02-13 MED ORDER — KETOROLAC TROMETHAMINE 60 MG/2ML IM SOLN
60.0000 mg | Freq: Once | INTRAMUSCULAR | Status: AC
  Administered 2018-02-13: 60 mg via INTRAMUSCULAR
  Filled 2018-02-13: qty 2

## 2018-02-13 MED ORDER — LOPERAMIDE HCL 2 MG PO CAPS
2.0000 mg | ORAL_CAPSULE | Freq: Once | ORAL | Status: AC
  Administered 2018-02-13: 2 mg via ORAL
  Filled 2018-02-13: qty 1

## 2018-02-13 NOTE — ED Triage Notes (Signed)
Pt reports taking opana and xanax and detoxing from it, last took last pm. Onset 1 hr ago of left side chest pain with sob. No acute distress is noted at this time.

## 2018-02-13 NOTE — ED Provider Notes (Signed)
Patient placed in Quick Look pathway, seen and evaluated   Chief Complaint: chest pain  HPI:   Patient reports chest pain starting an hour ago.  His pain is left anterior chest.  Is a little short of breath also.  He repors "coming down off of " xanax 5-10 mg every day last use 8pm last night and last Opana )he injects the opana.  40mg , about twice or 3 times a day) was about 3 hours before that.  He reports that he has used both of them every day for the past week.    ROS: No fevers (one)  Physical Exam:   Gen: No distress  Neuro: Awake and Alert  Skin: Warm    Focused Exam: No murmur, slightly tachycardic, regular rhythm no obvious dyspnea at rest.     Initiation of care has begun. The patient has been counseled on the process, plan, and necessity for staying for the completion/evaluation, and the remainder of the medical screening examination    Theodore Phelps 02/13/18 [place], DO 02/13/18 1540

## 2018-02-13 NOTE — ED Provider Notes (Signed)
[facility] EMERGENCY DEPARTMENT Provider Note   CSN: [id number] Arrival date & time: 02/13/18  1456     History   Chief Complaint Chief Complaint  Patient presents with  . Chest Pain  . Drug Problem    HPI Theodore Phelps is a 52 y.o. male.  HPI   Stopped using opana and xanax on Saturday, began to have body aches then used both again Sunday because of pain, then today has not had anything Vomiting Saturday, diarrhea and vomiting today, diffuse body aches Chest pain began this AM, feels like pressure, nothing makes it better or worse, not exertional, no shortness of breath.  No leg pain or swelling, cough, fever No family history of early heart disease Smoking Reports he has been using pain medications, followed by Opana starting 1 year ago.  Denies SI/HI Would like detox from using Opana and Xanax.  Denies tremors.   Past Medical History:  Diagnosis Date  . Abdominal hernia    right   . Cancer ([place])    Left kidney  . GERD (gastroesophageal reflux disease)   . Hepatitis    C  . Hypertension   . Inguinal hernia    Left    There are no active problems to display for this patient.   Past Surgical History:  Procedure Laterality Date  . CHOLECYSTECTOMY    . HERNIA REPAIR    . INGUINAL HERNIA REPAIR Left 01/19/2017   Procedure: LEFT HERNIA REPAIR INGUINAL ADULT;  Surgeon: Erroll Luna, MD;  Location: [facility];  Service: General;  Laterality: Left;  . INSERTION OF MESH Left 01/19/2017   Procedure: INSERTION OF MESH;  Surgeon: Erroll Luna, MD;  Location: [facility];  Service: General;  Laterality: Left;  . NEPHRECTOMY     left        Home Medications    Prior to Admission medications   Medication Sig Start Date End Date Taking? Authorizing Provider  amLODipine (NORVASC) 5 MG tablet Take 5 mg by mouth daily.    [provider]  Ca Carbonate-Mag Hydroxide (ROLAIDS PO) Take 2 tablets by mouth as needed (for acid reflux).    [provider]  cyclobenzaprine (FLEXERIL) 10 MG tablet Take 1 tablet (10 mg total) by mouth 2 (two) times daily as needed for muscle spasms. 02/13/18   Gareth Morgan, MD  ibuprofen (ADVIL,MOTRIN) 800 MG tablet Take 1 tablet (800 mg total) by mouth every 8 (eight) hours as needed. 01/19/17   Cornett, Marcello Moores, MD  loperamide (IMODIUM) 2 MG capsule Take 1 capsule (2 mg total) by mouth 4 (four) times daily as needed for diarrhea or loose stools. 02/13/18   Gareth Morgan, MD  ondansetron (ZOFRAN ODT) 4 MG disintegrating tablet Take 1 tablet (4 mg total) by mouth every 8 (eight) hours as needed for nausea or vomiting. 02/13/18   Gareth Morgan, MD  oxyCODONE (OXY IR/ROXICODONE) 5 MG immediate release tablet Take 1-2 tablets (5-10 mg total) by mouth every 6 (six) hours as needed for severe pain. 01/19/17   Erroll Luna, MD    Family History Family History  Problem Relation Age of Onset  . Cancer Brother     Social History Social History   Tobacco Use  . Smoking status: Current Every Day Smoker    Packs/day: 0.25    Types: Cigarettes  . Smokeless tobacco: Former Systems developer    Types: Snuff, Chew  Substance Use Topics  . Alcohol use: Yes    Comment: occasional  .  Drug use: Yes    Types: "Crack" cocaine, Opium     Allergies   Patient has no known allergies.   Review of Systems Review of Systems  Constitutional: Positive for fatigue. Negative for fever.  HENT: Negative for sore throat.   Eyes: Negative for visual disturbance.  Respiratory: Negative for shortness of breath.   Cardiovascular: Positive for chest pain.  Gastrointestinal: Positive for diarrhea, nausea and vomiting. Negative for abdominal pain.  Genitourinary: Negative for difficulty urinating.  Musculoskeletal: Positive for arthralgias and myalgias. Negative for back pain and neck stiffness.  Skin: Negative for rash.  Neurological: Negative for tremors, syncope and headaches.     Physical Exam Updated Vital  Signs BP (!) 128/94 (BP Location: Left Arm)   Pulse (!) 116   Temp 98.9 F (37.2 C) (Oral)   Resp 20   SpO2 97%   Physical Exam  Constitutional: He is oriented to person, place, and time. He appears well-developed and well-nourished. No distress.  HENT:  Head: Normocephalic and atraumatic.  Eyes: Conjunctivae and EOM are normal.  Neck: Normal range of motion.  Cardiovascular: Normal rate, regular rhythm, normal heart sounds and intact distal pulses. Exam reveals no gallop and no friction rub.  No murmur heard. Pulmonary/Chest: Effort normal and breath sounds normal. No respiratory distress. He has no wheezes. He has no rales.  Abdominal: Soft. He exhibits no distension. There is no tenderness. There is no guarding.  Musculoskeletal: He exhibits no edema.  Neurological: He is alert and oriented to person, place, and time.  Skin: Skin is warm and dry. He is not diaphoretic.  Nursing note and vitals reviewed.    ED Treatments / Results  Labs (all labs ordered are listed, but only abnormal results are displayed) Labs Reviewed  CBC WITH DIFFERENTIAL/PLATELET - Abnormal; Notable for the following components:      Result Value   WBC 10.8 (*)    Monocytes Absolute 1.1 (*)    All other components within normal limits  COMPREHENSIVE METABOLIC PANEL - Abnormal; Notable for the following components:   BUN 23 (*)    Creatinine, Ser 1.67 (*)    AST 113 (*)    ALT 165 (*)    GFR calc non Af Amer 46 (*)    GFR calc Af Amer 53 (*)    All other components within normal limits  RAPID URINE DRUG SCREEN, HOSP PERFORMED - Abnormal; Notable for the following components:   Benzodiazepines POSITIVE (*)    All other components within normal limits  ETHANOL  LIPASE, BLOOD  I-STAT TROPONIN, ED  I-STAT CG4 LACTIC ACID, ED  I-STAT CG4 LACTIC ACID, ED  I-STAT TROPONIN, ED    EKG EKG Interpretation  Date/Time:  Monday February 13 2018 15:06:47 EDT Ventricular Rate:  105 PR  Interval:  134 QRS Duration: 132 QT Interval:  374 QTC Calculation: 494 R Axis:   98 Text Interpretation:  Sinus tachycardia Right atrial enlargement Right bundle branch block Abnormal ECG Since prior ECG, rate has increased, no other significant changes Confirmed by Gareth Morgan [phone]) on 02/13/2018 7:55:18 PM Also confirmed by Gareth Morgan [phone])  on 02/13/2018 8:29:06 PM   Radiology Dg Chest 2 View  Result Date: 02/13/2018 CLINICAL DATA:  Chest pain short of breath EXAM: CHEST - 2 VIEW COMPARISON:  05/06/2015 FINDINGS: Cardiac and mediastinal contours normal. Lungs are clear. Chronic right posterior rib fracture fourth rib. IMPRESSION: No active cardiopulmonary disease. Electronically Signed   By: Franchot Gallo M.D.  On: 02/13/2018 16:35    Procedures Procedures (including critical care time)  Medications Ordered in ED Medications  ketorolac (TORADOL) injection 60 mg (60 mg Intramuscular Given 02/13/18 2158)  cyclobenzaprine (FLEXERIL) tablet 10 mg (10 mg Oral Given 02/13/18 2159)  loperamide (IMODIUM) capsule 2 mg (2 mg Oral Given 02/13/18 2159)  ondansetron (ZOFRAN-ODT) disintegrating tablet 4 mg (4 mg Oral Given 02/13/18 2159)     Initial Impression / Assessment and Plan / ED Course  I have reviewed the triage vital signs and the nursing notes.  Pertinent labs & imaging results that were available during my care of the patient were reviewed by me and considered in my medical decision making (see chart for details).     52 year old male with a history of nephrectomy and hypertension presents with concern for desire for rehab for Opana and Xanax use, with additional symptoms including nausea, vomiting, diarrhea, chills, diffuse body aches and chest pain.  Regarding his chest pain, EKG was done to evaluate me and shows no signs of acute ischemia or pericarditis.  Chest x-ray shows no signs of pneumonia, pneumothorax.  Delta troponins are both negative.  Have low  suspicion for pulmonary embolus or dissection by history. In setting of other body aches and withdrawal from opiates, feel this is contributor.   Symptoms of nausea, vomiting, diarrhea and chills along with diffuse body aches is most consistent with opiate withdrawal.  Patient has a very mild tachycardia, however no hypertension, no tremors, no signs of significant benzodiazepine withdrawal at this time.  His symptoms are primarily secondary to opiate withdrawal.  Recommend supportive care, with muscle relaxant, Zofran, and Imodium.  He denies SI, and does not meet criteria for inpatient psychiatric admission.  Discussed with patient recommendation for outpatient or inpatient rehab, however we are unable to provide that from the emergency department.  Commended patient on his efforts to discontinue chronic opiate use.  Case management gave patient list of resources, and gave prescriptions to help patient through withdrawal symptoms.  He is medically cleared, and appropriate for following up with rehab options provided.    Final Clinical Impressions(s) / ED Diagnoses   Final diagnoses:  Chest pain, unspecified type  Opiate withdrawal [facility])    ED Discharge Orders         Ordered    cyclobenzaprine (FLEXERIL) 10 MG tablet  2 times daily PRN     02/13/18 2242    ondansetron (ZOFRAN ODT) 4 MG disintegrating tablet  Every 8 hours PRN     02/13/18 2242    loperamide (IMODIUM) 2 MG capsule  4 times daily PRN,   Status:  Discontinued     02/13/18 2242    loperamide (IMODIUM) 2 MG capsule  4 times daily PRN     02/13/18 2243           Gareth Morgan, MD 02/14/18 [phone]

## 2018-02-13 NOTE — ED Notes (Signed)
E-signature not available. Pt verbalized understanding of DC instructions, prescriptions, and follow up care with outpatient resource guide. CSW at bedside.

## 2018-02-13 NOTE — ED Notes (Signed)
Pt and family requesting update, EDP is aware.  

## 2018-02-13 NOTE — Progress Notes (Signed)
CSW met with pt at pt's bedside. CSW provide resources for Rehab, inpatient and outpatient. CSW provided the number for [employer]SHA Hotline and Crisis Line.   Wendelyn Breslow, [facility]  [phone]

## 2019-02-04 ENCOUNTER — Inpatient Hospital Stay (HOSPITAL_COMMUNITY)
Admission: AD | Admit: 2019-02-04 | Discharge: 2019-02-07 | DRG: 885 | Disposition: A | Discharge status: Home / Self Care | Payer: 59 | Source: Other Acute Inpatient Hospital | Attending: Psychiatry | Admitting: Psychiatry

## 2019-02-04 DIAGNOSIS — R45851 Suicidal ideations: Secondary | ICD-10-CM | POA: Diagnosis present

## 2019-02-04 DIAGNOSIS — I10 Essential (primary) hypertension: Secondary | ICD-10-CM | POA: Diagnosis present

## 2019-02-04 DIAGNOSIS — F1124 Opioid dependence with opioid-induced mood disorder: Secondary | ICD-10-CM | POA: Diagnosis present

## 2019-02-04 DIAGNOSIS — G4700 Insomnia, unspecified: Secondary | ICD-10-CM | POA: Diagnosis present

## 2019-02-04 DIAGNOSIS — K219 Gastro-esophageal reflux disease without esophagitis: Secondary | ICD-10-CM | POA: Diagnosis present

## 2019-02-04 DIAGNOSIS — R45850 Homicidal ideations: Secondary | ICD-10-CM | POA: Diagnosis present

## 2019-02-04 DIAGNOSIS — F332 Major depressive disorder, recurrent severe without psychotic features: Secondary | ICD-10-CM | POA: Diagnosis present

## 2019-02-04 DIAGNOSIS — F17210 Nicotine dependence, cigarettes, uncomplicated: Secondary | ICD-10-CM | POA: Diagnosis present

## 2019-02-04 NOTE — BH Assessment (Signed)
Assessment Note  Theodore Phelps is an 53 y.o. divorced male who presents unaccompanied to [facility] ED reporting substance use, depressive symptoms and suicidal and homicidal ideation. Pt reports he has a history of substance abuse beginning in childhood. He states he uses approximately 2-10 bags of heroin intravenously daily and $10-20 worth of methamphetamines intravenously daily. He denies alcohol or other substance use. Pt says today he didn't have money and went to his drug dealer with the intent to assault him and steal the drugs. He says the only reason he didn't follow through was because there was a child present. Pt says he contacted some people in Narcotics Anonymous who encouraged him to come to [facility] for treatment. Pt says two weeks ago he overdosed with suicidal intent but someone present administered Narcan and revived him. He says he is still having thoughts of harming himself or someone else. Pt describes feeling depressed with symptoms including social withdrawal, loss of interest in usual pleasures, deceased concentration, fatigue and feelings of hopelessness, guilt and worthlessness. He denies any history of aggressive behavior. He denies auditory or visual hallucinations.  Pt reports he started using pain pills when he was a child. Pt says his longest period of sobriety is two years. He reports experiencing withdrawal symptoms when he stops using including body aches, nausea, sweats, tremor and insomnia. Pt reports he has been to several substance abuse treatment centers, the most recent being [facility] in 2019. He says he will have a few weeks of sobriety "and then something will happen and I will relapse."  Pt identifies consequences of his substance use as his primary stressor. He says he works for a [employer] and is supposed to arrive at 5 am but recently has been arriving hours late. He states he lives with his elderly father and then have frequent conflicts. Pt  identifies his mother and his sister as his primary supports but he avoids them because of his addiction. He says he has one adult child and two adult step-children. He denies any history of abuse or trauma. He denies access to firearms. He denies current legal problems.  Pt is dressed in hospital scrubs and appears somewhat disheveled. He is alert and oriented x4. Pt speaks in a clear tone, at moderate volume and normal pace. Motor behavior appears normal. Eye contact is fair. Pt's mood is depressed and affect is blunted. Thought process is coherent and relevant. There is no indication Pt is currently responding to internal stimuli or experiencing delusional thought content. Pt was cooperative throughout assessment. He says he is willing to sign voluntarily into a psychiatric facility.   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 Abdominal hernia    right   . Cancer ([place])    Left kidney  . GERD (gastroesophageal reflux disease)   . Hepatitis    C  . Hypertension   . Inguinal hernia    Left    Past Surgical History:  Procedure Laterality Date  . CHOLECYSTECTOMY    . HERNIA REPAIR    . INGUINAL HERNIA REPAIR Left 01/19/2017   Procedure: LEFT HERNIA REPAIR INGUINAL ADULT;  Surgeon: Erroll Luna, MD;  Location: [place];  Service: General;  Laterality: Left;  . INSERTION OF MESH Left 01/19/2017   Procedure: INSERTION OF MESH;  Surgeon: Erroll Luna, MD;  Location: [facility];  Service: General;  Laterality: Left;  . NEPHRECTOMY     left    Family History:  Family History  Problem Relation Age of Onset  . Cancer Brother     Social History:  reports that he has been smoking cigarettes. He has been smoking about 0.25 packs per day. He has quit using smokeless tobacco.  His smokeless  tobacco use included snuff and chew. He reports current alcohol use. He reports current drug use. Drugs: "Crack" cocaine and Opium.  Additional Social History:  Alcohol / Drug Use Pain Medications: Pt has a history of abusing pain medications Prescriptions: Pt has a history of abusing pain medications Over the Counter: Denies abuse History of alcohol / drug use?: Yes Longest period of sobriety (when/how long): 2 years Negative Consequences of Use: Financial, Personal relationships, Work / School Withdrawal Symptoms: Nausea / Vomiting, Sweats, Cramps, Tremors Substance #1 Name of Substance 1: Heroin (I.V.) 1 - Age of First Use: 56 1 - Amount (size/oz): 2-10 bags 1 - Frequency: Daily 1 - Duration: four years 1 - Last Use / Amount: 02/04/2019 Substance #2 Name of Substance 2: Methamphetamines (I.V.) 2 - Age of First Use: 50 2 - Amount (size/oz): $10-20 worth 2 - Frequency: Daily 2 - Duration: Three years 2 - Last Use / Amount: 02/04/2019  CIWA:   COWS:    Allergies: No Known Allergies  Home Medications:  No medications prior to admission.    OB/GYN Status:  No LMP for male patient.  General Assessment Data Location of Assessment: [facility] TTS Assessment: Out of system Is this a Tele or Face-to-Face Assessment?: Tele Assessment Is this an Initial Assessment or a Re-assessment for this encounter?: Initial Assessment Patient Accompanied by:: N/A Language Other than English: No Living Arrangements: Other (Comment)(Lives with father) What gender do you identify as?: Male Marital status: Divorced [place] name: NA Pregnancy Status: No Living Arrangements: Parent Can pt return to current living arrangement?: Yes Admission Status: Voluntary Is patient capable of signing voluntary admission?: Yes Referral Source: Self/Family/Friend Insurance type: Health and safety inspector Care Plan Living Arrangements: Parent Legal Guardian: Other:(Self) Name of  Psychiatrist: None Name of Therapist: None  Education Status Is patient currently in school?: No Is the patient employed, unemployed or receiving disability?: Employed  Risk to self with the past 6 months Suicidal Ideation: Yes-Currently Present Has patient been a risk to self within the past 6 months prior to admission? : Yes Suicidal Intent: Yes-Currently Present Has patient had any suicidal intent within the past 6 months prior to admission? : Yes Is patient at risk for suicide?: Yes Suicidal Plan?: Yes-Currently Present Has patient had any suicidal plan within the past 6 months prior to admission? : Yes Specify Current Suicidal Plan: plan to overdose on heroin Access to Means: Yes Specify Access to Suicidal Means: Using heroin daily What has been your use of drugs/alcohol within the last 12 months?: Pt reports using heroin and methamphetamines daily Previous Attempts/Gestures: Yes How many times?: 1 Other Self Harm Risks: None Triggers for Past Attempts: Other (Comment)(Substance use) Intentional Self Injurious Behavior: None Family Suicide History: No Recent stressful life event(s): Financial Problems Persecutory voices/beliefs?: No Depression: Yes Depression Symptoms: Despondent, Insomnia, Isolating, Fatigue, Guilt, Loss of interest in usual pleasures, Feeling worthless/self pity, Feeling angry/irritable Substance abuse history and/or treatment for substance abuse?: Yes Suicide prevention information given to non-admitted patients: Not applicable  Risk to Others within the past 6 months Homicidal Ideation:  Yes-Currently Present Does patient have any lifetime risk of violence toward others beyond the six months prior to admission? : No Thoughts of Harm to Others: Yes-Currently Present Comment - Thoughts of Harm to Others: Thoughts of assaulting drug dealer Current Homicidal Intent: No Current Homicidal Plan: No Access to Homicidal Means: No Identified Victim: Drug  dealer History of harm to others?: No Assessment of Violence: None Noted Violent Behavior Description: Pt denies history of violence Does patient have access to weapons?: No Criminal Charges Pending?: No Does patient have a court date: No Is patient on probation?: No  Psychosis Hallucinations: None noted Delusions: None noted  Mental Status Report Appearance/Hygiene: In scrubs, Disheveled Eye Contact: Fair Motor Activity: Unremarkable Speech: Logical/coherent Level of Consciousness: Alert Mood: Depressed, Sad Affect: Blunted Anxiety Level: None Thought Processes: Coherent, Relevant Judgement: Partial Orientation: Person, Place, Time, Situation Obsessive Compulsive Thoughts/Behaviors: None  Cognitive Functioning Concentration: Normal Memory: Recent Intact, Remote Intact Is patient IDD: No Insight: Fair Impulse Control: Fair Appetite: Fair Have you had any weight changes? : No Change Sleep: Decreased Total Hours of Sleep: 4 Vegetative Symptoms: None  ADLScreening [facility] Assessment Services) Patient's cognitive ability adequate to safely complete daily activities?: Yes Patient able to express need for assistance with ADLs?: Yes Independently performs ADLs?: Yes (appropriate for developmental age)  Prior Inpatient Therapy Prior Inpatient Therapy: Yes Prior Therapy Dates: 2019, multiple admits Prior Therapy Facilty/Provider(s): [facility] and other substance abuse treatment centers Reason for Treatment: substance abuse  Prior Outpatient Therapy Prior Outpatient Therapy: Yes Prior Therapy Dates: Current Prior Therapy Facilty/Provider(s): [facility] Reason for Treatment: Substance use Does patient have an ACCT team?: No Does patient have Intensive In-House Services?  : No Does patient have [facility] services? : No Does patient have P4CC services?: No  ADL Screening (condition at time of admission) Patient's cognitive ability adequate to safely complete daily  activities?: Yes Is the patient deaf or have difficulty hearing?: No Does the patient have difficulty seeing, even when wearing glasses/contacts?: No Does the patient have difficulty concentrating, remembering, or making decisions?: No Patient able to express need for assistance with ADLs?: Yes Does the patient have difficulty dressing or bathing?: No Independently performs ADLs?: Yes (appropriate for developmental age) Does the patient have difficulty walking or climbing stairs?: No Weakness of Legs: None Weakness of Arms/Hands: None  Home Assistive Devices/Equipment Home Assistive Devices/Equipment: None    Abuse/Neglect Assessment (Assessment to be complete while patient is alone) Abuse/Neglect Assessment Can Be Completed: Yes Physical Abuse: Denies Verbal Abuse: Denies Sexual Abuse: Denies Exploitation of patient/patient's resources: Denies Self-Neglect: Denies     Regulatory affairs officer (For Healthcare) Does Patient Have a Medical Advance Directive?: No Would patient like information on creating a medical advance directive?: No - Patient declined          Disposition: Gave clinical report to Priscille Loveless, NP who said Pt meets criteria for inpatient dual-diagnosis treatment. Lavell Luster, [facility] confirmed bed availability. Pt accepted to the service of Dr Gabriel Earing, room 301-1.  Disposition Initial Assessment Completed for this Encounter: Yes Disposition of Patient: Admit Type of inpatient treatment program: Adult Patient refused recommended treatment: No  On Site Evaluation by:   Reviewed with Physician:    Evelena Peat, [facility], [facility], [place] [facility] Triage Specialist [phone]  Evelena Peat 02/04/2019 8:36 PM

## 2019-02-05 ENCOUNTER — Other Ambulatory Visit: Payer: Self-pay

## 2019-02-05 ENCOUNTER — Encounter (HOSPITAL_COMMUNITY): Payer: Self-pay | Admitting: *Deleted

## 2019-02-05 DIAGNOSIS — F332 Major depressive disorder, recurrent severe without psychotic features: Secondary | ICD-10-CM | POA: Diagnosis present

## 2019-02-05 DIAGNOSIS — F1124 Opioid dependence with opioid-induced mood disorder: Secondary | ICD-10-CM

## 2019-02-05 LAB — LIPID PANEL
Cholesterol: 185 mg/dL (ref 0–200)
HDL: 46 mg/dL (ref >40)
LDL Cholesterol: 118 mg/dL — ABNORMAL HIGH (ref 0–99)
Total CHOL/HDL Ratio: 4 RATIO
Triglycerides: 104 mg/dL (ref <150)
VLDL: 21 mg/dL (ref 0–40)

## 2019-02-05 LAB — TSH: TSH: 0.136 u[IU]/mL — ABNORMAL LOW (ref 0.350–4.500)

## 2019-02-05 MED ORDER — NICOTINE 21 MG/24HR TD PT24
21.0000 mg | MEDICATED_PATCH | Freq: Every day | TRANSDERMAL | Status: DC
  Administered 2019-02-05 – 2019-02-06 (×2): 21 mg via TRANSDERMAL
  Filled 2019-02-05 (×5): qty 1

## 2019-02-05 MED ORDER — ALUM & MAG HYDROXIDE-SIMETH 200-200-20 MG/5ML PO SUSP: 30.0000 mL | ORAL | Status: DC | PRN

## 2019-02-05 MED ORDER — ENSURE ENLIVE PO LIQD
237.0000 mL | Freq: Two times a day (BID) | ORAL | Status: DC
  Administered 2019-02-05: 08:00:00 237 mL via ORAL

## 2019-02-05 MED ORDER — ACETAMINOPHEN 325 MG PO TABS
650.0000 mg | ORAL_TABLET | Freq: Four times a day (QID) | ORAL | Status: DC | PRN
  Administered 2019-02-05 – 2019-02-06 (×2): 650 mg via ORAL
  Filled 2019-02-05 (×2): qty 2

## 2019-02-05 MED ORDER — CLONIDINE HCL 0.1 MG PO TABS
0.1000 mg | ORAL_TABLET | ORAL | Status: DC
  Filled 2019-02-05 (×4): qty 1

## 2019-02-05 MED ORDER — ONDANSETRON 4 MG PO TBDP
4.0000 mg | ORAL_TABLET | Freq: Four times a day (QID) | ORAL | Status: DC | PRN
  Administered 2019-02-05 – 2019-02-06 (×3): 4 mg via ORAL
  Filled 2019-02-05 (×3): qty 1

## 2019-02-05 MED ORDER — DICYCLOMINE HCL 20 MG PO TABS
20.0000 mg | ORAL_TABLET | Freq: Four times a day (QID) | ORAL | Status: DC | PRN
  Administered 2019-02-05 – 2019-02-06 (×3): 20 mg via ORAL
  Filled 2019-02-05 (×4): qty 1

## 2019-02-05 MED ORDER — ADULT MULTIVITAMIN W/MINERALS CH
1.0000 | ORAL_TABLET | Freq: Every day | ORAL | Status: DC
  Administered 2019-02-05 – 2019-02-07 (×3): 1 via ORAL
  Filled 2019-02-05 (×5): qty 1

## 2019-02-05 MED ORDER — AMLODIPINE BESYLATE 5 MG PO TABS
5.0000 mg | ORAL_TABLET | Freq: Every day | ORAL | Status: DC
  Administered 2019-02-05 – 2019-02-07 (×3): 5 mg via ORAL
  Filled 2019-02-05 (×6): qty 1

## 2019-02-05 MED ORDER — METHOCARBAMOL 500 MG PO TABS
500.0000 mg | ORAL_TABLET | Freq: Three times a day (TID) | ORAL | Status: DC | PRN
  Administered 2019-02-05 – 2019-02-06 (×3): 500 mg via ORAL
  Filled 2019-02-05 (×3): qty 1

## 2019-02-05 MED ORDER — MAGNESIUM HYDROXIDE 400 MG/5ML PO SUSP: 30.0000 mL | Freq: Every day | ORAL | Status: DC | PRN

## 2019-02-05 MED ORDER — LOPERAMIDE HCL 2 MG PO CAPS
2.0000 mg | ORAL_CAPSULE | ORAL | Status: DC | PRN
  Administered 2019-02-05: 4 mg via ORAL
  Administered 2019-02-06: 18:00:00 2 mg via ORAL
  Filled 2019-02-05 (×2): qty 2

## 2019-02-05 MED ORDER — CLONIDINE HCL 0.1 MG PO TABS
0.1000 mg | ORAL_TABLET | Freq: Every day | ORAL | Status: DC
  Filled 2019-02-05: qty 1

## 2019-02-05 MED ORDER — CLONIDINE HCL 0.1 MG PO TABS
0.1000 mg | ORAL_TABLET | Freq: Four times a day (QID) | ORAL | Status: AC
  Administered 2019-02-05 – 2019-02-06 (×6): 0.1 mg via ORAL
  Filled 2019-02-05 (×10): qty 1

## 2019-02-05 MED ORDER — SERTRALINE HCL 50 MG PO TABS
50.0000 mg | ORAL_TABLET | Freq: Every day | ORAL | Status: DC
  Administered 2019-02-05 – 2019-02-07 (×3): 50 mg via ORAL
  Filled 2019-02-05 (×5): qty 1

## 2019-02-05 MED ORDER — NAPROXEN 500 MG PO TABS
500.0000 mg | ORAL_TABLET | Freq: Two times a day (BID) | ORAL | Status: DC | PRN
  Administered 2019-02-05 – 2019-02-06 (×2): 500 mg via ORAL
  Filled 2019-02-05 (×2): qty 1

## 2019-02-05 MED ORDER — TRAZODONE HCL 50 MG PO TABS
50.0000 mg | ORAL_TABLET | Freq: Every evening | ORAL | Status: DC | PRN
  Administered 2019-02-05: 50 mg via ORAL
  Filled 2019-02-05: qty 1

## 2019-02-05 MED ORDER — HYDROXYZINE HCL 25 MG PO TABS
25.0000 mg | ORAL_TABLET | Freq: Four times a day (QID) | ORAL | Status: DC | PRN
  Administered 2019-02-05 – 2019-02-07 (×5): 25 mg via ORAL
  Filled 2019-02-05 (×5): qty 1

## 2019-02-05 NOTE — Tx Team (Signed)
Interdisciplinary Treatment and Diagnostic Plan Update  02/05/2019 Time of Session: 9:00am Theodore Phelps MRN: [id number]  Principal Diagnosis: <principal problem not specified>  Secondary Diagnoses: Active Problems:   Severe recurrent major depression without psychotic features (HCC)   Current Medications:  Current Facility-Administered Medications  Medication Dose Route Frequency Provider Last Rate Last Dose  . acetaminophen (TYLENOL) tablet 650 mg  650 mg Oral Q6H PRN Lindon Romp A, NP      . alum & mag hydroxide-simeth (MAALOX/MYLANTA) 200-200-20 MG/5ML suspension 30 mL  30 mL Oral Q4H PRN Lindon Romp A, NP      . amLODipine (NORVASC) tablet 5 mg  5 mg Oral Daily Lindon Romp A, NP   5 mg at 02/05/19 0819  . cloNIDine (CATAPRES) tablet 0.1 mg  0.1 mg Oral QID Lindon Romp A, NP   0.1 mg at 02/05/19 0819   Followed by  . [START ON 02/07/2019] cloNIDine (CATAPRES) tablet 0.1 mg  0.1 mg Oral BH-qamhs Rozetta Nunnery, NP       Followed by  . [START ON 02/09/2019] cloNIDine (CATAPRES) tablet 0.1 mg  0.1 mg Oral QAC breakfast Lindon Romp A, NP      . dicyclomine (BENTYL) tablet 20 mg  20 mg Oral Q6H PRN Lindon Romp A, NP   20 mg at 02/05/19 0356  . feeding supplement (ENSURE ENLIVE) (ENSURE ENLIVE) liquid 237 mL  237 mL Oral BID BM Cobos, Myer Peer, MD   237 mL at 02/05/19 2952  . hydrOXYzine (ATARAX/VISTARIL) tablet 25 mg  25 mg Oral Q6H PRN Lindon Romp A, NP   25 mg at 02/05/19 0356  . loperamide (IMODIUM) capsule 2-4 mg  2-4 mg Oral PRN Lindon Romp A, NP      . magnesium hydroxide (MILK OF MAGNESIA) suspension 30 mL  30 mL Oral Daily PRN Lindon Romp A, NP      . methocarbamol (ROBAXIN) tablet 500 mg  500 mg Oral Q8H PRN Lindon Romp A, NP   500 mg at 02/05/19 0356  . naproxen (NAPROSYN) tablet 500 mg  500 mg Oral BID PRN Lindon Romp A, NP   500 mg at 02/05/19 0356  . nicotine (NICODERM CQ - dosed in mg/24 hours) patch 21 mg  21 mg Transdermal Daily Cobos, Myer Peer, MD   21 mg at  02/05/19 0820  . ondansetron (ZOFRAN-ODT) disintegrating tablet 4 mg  4 mg Oral Q6H PRN Lindon Romp A, NP   4 mg at 02/05/19 0356  . traZODone (DESYREL) tablet 50 mg  50 mg Oral QHS PRN Lindon Romp A, NP       PTA Medications: No medications prior to admission.    Patient Stressors: Occupational concerns Substance abuse  Patient Strengths: Average or above average Engineer, building services for treatment/growth Supportive family/friends  Treatment Modalities: Medication Management, Group therapy, Case management,  1 to 1 session with clinician, Psychoeducation, Recreational therapy.   Physician Treatment Plan for Primary Diagnosis: <principal problem not specified> Long Term Goal(s):     Short Term Goals:    Medication Management: Evaluate patient's response, side effects, and tolerance of medication regimen.  Therapeutic Interventions: 1 to 1 sessions, Unit Group sessions and Medication administration.  Evaluation of Outcomes: Not Met  Physician Treatment Plan for Secondary Diagnosis: Active Problems:   Severe recurrent major depression without psychotic features ([place])  Long Term Goal(s):     Short Term Goals:       Medication Management: Evaluate patient's response, side effects, and  tolerance of medication regimen.  Therapeutic Interventions: 1 to 1 sessions, Unit Group sessions and Medication administration.  Evaluation of Outcomes: Not Met   RN Treatment Plan for Primary Diagnosis: <principal problem not specified> Long Term Goal(s): Knowledge of disease and therapeutic regimen to maintain health will improve  Short Term Goals: Ability to verbalize feelings will improve, Ability to identify and develop effective coping behaviors will improve and Compliance with prescribed medications will improve  Medication Management: RN will administer medications as ordered by provider, will assess and evaluate patient's response and provide education to  patient for prescribed medication. RN will report any adverse and/or side effects to prescribing provider.  Therapeutic Interventions: 1 on 1 counseling sessions, Psychoeducation, Medication administration, Evaluate responses to treatment, Monitor vital signs and CBGs as ordered, Perform/monitor CIWA, COWS, AIMS and Fall Risk screenings as ordered, Perform wound care treatments as ordered.  Evaluation of Outcomes: Not Met   LCSW Treatment Plan for Primary Diagnosis: <principal problem not specified> Long Term Goal(s): Safe transition to appropriate next level of care at discharge, Engage patient in therapeutic group addressing interpersonal concerns.  Short Term Goals: Engage patient in aftercare planning with referrals and resources, Increase social support, Increase emotional regulation, Identify triggers associated with mental health/substance abuse issues and Increase skills for wellness and recovery  Therapeutic Interventions: Assess for all discharge needs, 1 to 1 time with Social worker, Explore available resources and support systems, Assess for adequacy in community support network, Educate family and significant other(s) on suicide prevention, Complete Psychosocial Assessment, Interpersonal group therapy.  Evaluation of Outcomes: Not Met   Progress in Treatment: Attending groups: No. New to unit.  Participating in groups: No. Taking medication as prescribed: Yes. Toleration medication: Yes. Family/Significant other contact made: No, will contact:  supports if consents are granted. Patient understands diagnosis: Yes. Discussing patient identified problems/goals with staff: No. Medical problems stabilized or resolved: Yes. Denies suicidal/homicidal ideation: Yes. Issues/concerns per patient self-inventory: Yes.  New problem(s) identified: Yes, Describe:  financial stressors  New Short Term/Long Term Goal(s): detox, medication management for mood stabilization; elimination of SI  thoughts; development of comprehensive mental wellness/sobriety plan.  Patient Goals:  "Trying to stay clean."  Discharge Plan or Barriers: CSW assessing for appropriate referrals.  Reason for Continuation of Hospitalization: Anxiety Depression Medication stabilization Withdrawal symptoms  Estimated Length of Stay: 3-5 days  Attendees: Patient: Theodore Phelps 02/05/2019 9:44 AM  Physician: Larene Beach 02/05/2019 9:44 AM  Nursing: Rise Paganini, RN 02/05/2019 9:44 AM  RN Care Manager: 02/05/2019 9:44 AM  Social Worker: Stephanie Acre, [place] 02/05/2019 9:44 AM  Recreational Therapist:  02/05/2019 9:44 AM  Other: Harriett Sine, NP 02/05/2019 9:44 AM  Other:  02/05/2019 9:44 AM  Other: 02/05/2019 9:44 AM    Scribe for Treatment Team: Joellen Jersey, [place] 02/05/2019 9:44 AM

## 2019-02-05 NOTE — BHH Suicide Risk Assessment (Addendum)
[facility] Admission Suicide Risk Assessment   Nursing information obtained from:  Patient Demographic factors:  Male, Caucasian, Unemployed, Living alone, Low socioeconomic status Current Mental Status:  Suicidal ideation indicated by patient, Suicidal ideation indicated by others, Self-harm thoughts, Self-harm behaviors Loss Factors:  Financial problems / change in socioeconomic status Historical Factors:  Prior suicide attempts Risk Reduction Factors:  Sense of responsibility to family  Total Time spent with patient: 45 minutes Principal Problem: Opiate Use Disorder, Methamphetamine Use Disorder, 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  World Pharmacologist [facility]). Score between 0-7:  no or low risk or alcohol related problems. Score between 8-15:  moderate risk of alcohol related problems. Score between 16-19:  high risk of alcohol related problems. Score 20 or above:  warrants further diagnostic evaluation for alcohol dependence and treatment.   CLINICAL FACTORS:  81, single, has three adult children, currently homeless, had been living with his father recently. Presented to [facility] ED voluntarily reporting depression, substance dependence. He states he had developed violent ideations towards the man who has been selling him drugs , mainly thinking of robbing him for drugs. States he came close to actually carrying this out but decided against it. States " I am not that kind of person" and felt his addiction was " out of control". Patient reports depression and presents with a constricted affect. He endorses neuro-vegetative symptoms of depression to include poor sleep, low  energy level, poor appetite anhedonia, suicidal ideations, with recent thoughts of overdosing on opiates .States that 2 weeks ago had attempted suicide by overdosing on heroin. Did not seek treatment at the time. Reports history of substance abuse, has been using heroin intravenously daily, as well as methamphetamine several times a week. Denies alcohol or BZD use . Reports last used drugs yesterday. Reports one prior psychiatric admission for depression in the context of marital discord.History of prior suicide attempts. Did not seek treatment at the time. Denies history of mania or of psychosis, denies history of PTSD.Does endorse history of anxiety/occasional panic attacks. Denies history of violence . History of HTN, NKDA, reports was not taking any medications for several months prior to admission. Paternal Barbaraann Rondo has history of substance abuse and two other uncles have committed suicide   Currently reports some symptoms of opiate WDL- aches, myalgias, feeling " hot and cold", restless, nausea. No vomiting or diarrhea at this time.  Dx- Opiate Dependence, Methamphetamine Depedence, Substance Induced Mood Disorder versus MDD    Plan- Inpatient admission. We discussed treatment options, agrees to antidepressant trial. Start Zoloft 50 mgrs QDAY- side effects reviewed. Opiate detox ( Clonidine )  protocol for opiate WDL     Musculoskeletal: Strength & Muscle Tone: within normal limits Gait & Station: normal Patient leans: N/A  Psychiatric Specialty Exam: Physical Exam  ROS reports headache, no chest pain, no dyspnea at room air, no coughing, no vomiting, no diarrhea, (+) myalgias attributed to opiate WDL, no fever , no rash  Blood pressure 127/89, pulse 94, temperature 98 F (36.7 C), temperature source Oral, resp. rate 20, height 6' (1.829 m), weight 74.8 kg, SpO2 98 %.Body mass index is 22.38 kg/m.  General Appearance: Fairly Groomed  Eye Contact:  Fair  Speech:  Normal  Rate  Volume:   Decreased  Mood:  Depressed and Dysphoric  Affect:  Congruent  Thought Process:  Linear and Descriptions of Associations: Intact  Orientation:  Other:  fully alert and attentive oriented x 3   Thought Content:  no hallucinations, no delusions, not internally preoccupied   Suicidal Thoughts:  No denies suicidal or self injurious ideations, contracts for safety. Currently denies homicidal or violent ideations towards anyone.  Homicidal Thoughts:  No  Memory:  recent and remote grossly intact   Judgement:  Fair  Insight:  Fair  Psychomotor Activity:  Decreased  Concentration:  Concentration: Fair and Attention Span: Fair  Recall:  Good  Fund of Knowledge:  Good  Language:  Good  Akathisia:  Negative  Handed:  Right  AIMS (if indicated):     Assets:  Communication Skills Desire for Improvement Resilience  ADL's:  Intact  Cognition:  WNL  Sleep:  Number of Hours: 2.75      COGNITIVE FEATURES THAT CONTRIBUTE TO RISK:  Closed-mindedness, Loss of executive function and Polarized thinking    SUICIDE RISK:   Moderate:  Frequent suicidal ideation with limited intensity, and duration, some specificity in terms of plans, no associated intent, good self-control, limited dysphoria/symptomatology, some risk factors present, and identifiable protective factors, including available and accessible social support.  PLAN OF CARE: Patient will be admitted to inpatient psychiatric unit for stabilization and safety. Will provide and encourage milieu participation. Provide medication management and maked adjustments as needed.  Will also add medication to address opiate WDL as needed- Will follow daily.    I certify that inpatient services furnished can reasonably be expected to improve the patient's condition.   Jenne Campus, MD 02/05/2019, 1:13 PM

## 2019-02-05 NOTE — Progress Notes (Signed)
Recreation Therapy Notes  Date:  10.19.20 Time: 0930 Location: 300 Hall Group Room  Group Topic: Stress Management  Goal Area(s) Addresses:  Patient will identify positive stress management techniques. Patient will identify benefits of using stress management post d/c.  Intervention: Stress Management  Activity :  Meditation.  LRT played a meditation that focused on making the most of your day and viewing each moment as a new start.  Patients were to follow along as meditation was played to fully engage in group.  Education:  Stress Management, Discharge Planning.   Education Outcome: Acknowledges Education  Clinical Observations/Feedback:  Pt did not attend activity.    Victorino Sparrow, LRT/CTRS         Victorino Sparrow A 02/05/2019 11:14 AM

## 2019-02-05 NOTE — Tx Team (Signed)
Initial Treatment Plan 02/05/2019 2:28 AM Lindie Spruce [id number]    PATIENT STRESSORS: Occupational concerns Substance abuse   PATIENT STRENGTHS: Average or above average intelligence Financial means Motivation for treatment/growth Supportive family/friends   PATIENT IDENTIFIED PROBLEMS: Substance abuse (opiates, methamphetamine)  Depression  Suicidal Ideation      "I'm not dead yet"           DISCHARGE CRITERIA:  Adequate post-discharge living arrangements Improved stabilization in mood, thinking, and/or behavior Motivation to continue treatment in a less acute level of care Need for constant or close observation no longer present Verbal commitment to aftercare and medication compliance Withdrawal symptoms are absent or subacute and managed without 24-hour nursing intervention  PRELIMINARY DISCHARGE PLAN: Attend 12-step recovery group Outpatient therapy Return to previous work or school arrangements  PATIENT/FAMILY INVOLVEMENT: This treatment plan has been presented to and reviewed with the patient, Theodore Phelps.  The patient and family have been given the opportunity to ask questions and make suggestions.  Margaretann Loveless, RN 02/05/2019, 2:28 AM

## 2019-02-05 NOTE — Progress Notes (Signed)
NUTRITION ASSESSMENT  Pt identified as at risk on the Malnutrition Screen Tool  INTERVENTION: 1. Multivitamin with minerals daily  NUTRITION DIAGNOSIS: Unintentional weight loss related to sub-optimal intake as evidenced by pt report.   Goal: Pt to meet >/= 90% of their estimated nutrition needs.  Monitor:  PO intake  Assessment:  Pt admitted with depression and substance abuse (heroin, meth). Pt reports fair appetite but does not report weight changes. Per weight records, no recent weight loss noted. Will order daily MVI.  Height: Ht Readings from Last 1 Encounters:  02/05/19 6' (1.829 m)    Weight: Wt Readings from Last 1 Encounters:  02/05/19 74.8 kg    Weight Hx: Wt Readings from Last 10 Encounters:  02/05/19 74.8 kg  01/19/17 85.7 kg  01/14/17 85.7 kg    BMI:  Body mass index is 22.38 kg/m. Pt meets criteria for normal based on current BMI.  Estimated Nutritional Needs: Kcal: 25-30 kcal/kg Protein: > 1 gram protein/kg Fluid: 1 ml/kcal  Diet Order:  Diet Order            Diet regular Room service appropriate? Yes; Fluid consistency: Thin  Diet effective now             Pt is also offered choice of unit snacks mid-morning and mid-afternoon.  Pt is eating as desired.   Lab results and medications reviewed.   Clayton Bibles, MS, RD, LDN Inpatient Clinical Dietitian Pager: [phone] After Hours Pager: [phone]

## 2019-02-05 NOTE — H&P (Addendum)
Psychiatric Admission Assessment Adult  Patient Identification: Theodore Phelps MRN:  [id number] Date of Evaluation:  02/05/2019 Chief Complaint: "I just need to stop using." Principal Diagnosis: <principal problem not specified> Diagnosis:  Active Problems:   Severe recurrent major depression without psychotic features ([place])   Opioid dependence with opioid-induced mood disorder ([place])  History of Present Illness: Theodore Phelps is a 53 year old male with history of polysubstance dependence, hep C, HTN, GERD, renal cell carcinoma s/p L nephrectomy, presenting for treatment of suicidal ideation and homicidal ideation toward his drug dealer. He presents with depressed affect and minimal speech. He reports daily IV use of heroin and use of meth every other day for weeks, with last use of both yesterday. He reports recent overdoses, but the people he was using with revived him with Narcan. He had run out of money for drugs and pursued his drug dealer with intent to hurt him so that he could steal the drugs. He did not hurt the drug dealer due to there being a child there, and he reports that he became afraid because he is not normally a violent person. He presented voluntarily to [facility] ED. He reports he has been staying with his father but was asked to leave recently related to drug use. He has long history of polysubstance dependence and reports his longest period of sobriety was two years, which was 10-15 years ago. He reports current withdrawal symptoms- nausea, muscle aches, diaphoresis, restless legs, and cold chills. UDS was positive for amphetamines and opioids. He reports SI to overdose on heroin. He denies current HI. Denies AVH. He denies history of aggressive behaviors.  Associated Signs/Symptoms: Depression Symptoms:  depressed mood, anhedonia, insomnia, fatigue, feelings of worthlessness/guilt, suicidal thoughts with specific plan, decreased appetite, (Hypo) Manic Symptoms:  denies Anxiety  Symptoms:  Agoraphobia, Panic Symptoms, Psychotic Symptoms:  denies PTSD Symptoms: Negative Total Time spent with patient: 45 minutes  Past Psychiatric History: History of polysubstance abuse since childhood, with multiple rehab admissions, most recently at [facility] in 2019. One prior hospitalization for depression years ago related to conflict with his wife. Two prior suicide attempts, with most recent two weeks ago via overdose on heroin/meth. Denies history of aggressive behaviors, mania, or psychosis.   Is the patient at risk to self? Yes.    Has the patient been a risk to self in the past 6 months? Yes.    Has the patient been a risk to self within the distant past? Yes.    Is the patient a risk to others? Yes.    Has the patient been a risk to others in the past 6 months? No.  Has the patient been a risk to others within the distant past? No.   Prior Inpatient Therapy: Prior Inpatient Therapy: Yes Prior Therapy Dates: 2019, multiple admits Prior Therapy Facilty/Provider(s): [facility] and other substance abuse treatment centers Reason for Treatment: substance abuse Prior Outpatient Therapy: Prior Outpatient Therapy: Yes Prior Therapy Dates: Current Prior Therapy Facilty/Provider(s): [facility] Reason for Treatment: Substance use Does patient have an ACCT team?: No Does patient have Intensive In-House Services?  : No Does patient have [facility] services? : No Does patient have P4CC services?: No  Alcohol Screening: 1. How often do you have a drink containing alcohol?: Never 2. How many drinks containing alcohol do you have on a typical day when you are drinking?: 1 or 2 3. How often do you have six or more drinks on one occasion?: Never AUDIT-C Score: 0 4. How often  during the last year have you found that you were not able to stop drinking once you had started?: Never 5. How often during the last year have you failed to do what was normally expected from you becasue of drinking?:  Never 6. How often during the last year have you needed a first drink in the morning to get yourself going after a heavy drinking session?: Never 7. How often during the last year have you had a feeling of guilt of remorse after drinking?: Never 8. How often during the last year have you been unable to remember what happened the night before because you had been drinking?: Never 9. Have you or someone else been injured as a result of your drinking?: No 10. Has a relative or friend or a doctor or another health worker been concerned about your drinking or suggested you cut down?: No Alcohol Use Disorder Identification Test Final Score (AUDIT): 0 Alcohol Brief Interventions/Follow-up: AUDIT Score <7 follow-up not indicated Substance Abuse History in the last 12 months:  Yes.   Consequences of Substance Abuse: Medical Consequences:  hepatis C, cholecystitis Family Consequences:  kicked out of father's home; no contact with his children Withdrawal Symptoms:   Cramps Diaphoresis Nausea Tremors Previous Psychotropic Medications: Yes  Psychological Evaluations: No  Past Medical History:  Past Medical History:  Diagnosis Date  . Abdominal hernia    right   . Cancer ([place])    Left kidney  . GERD (gastroesophageal reflux disease)   . Hepatitis    C  . Hypertension   . Inguinal hernia    Left    Past Surgical History:  Procedure Laterality Date  . CHOLECYSTECTOMY    . HERNIA REPAIR    . INGUINAL HERNIA REPAIR Left 01/19/2017   Procedure: LEFT HERNIA REPAIR INGUINAL ADULT;  Surgeon: Erroll Luna, MD;  Location: [facility];  Service: General;  Laterality: Left;  . INSERTION OF MESH Left 01/19/2017   Procedure: INSERTION OF MESH;  Surgeon: Erroll Luna, MD;  Location: [place];  Service: General;  Laterality: Left;  . NEPHRECTOMY     left   Family History:  Family History  Problem Relation Age of Onset  . Cancer Brother    Family Psychiatric  History: Two paternal uncles who died by  suicide. Tobacco Screening: Have you used any form of tobacco in the last 30 days? (Cigarettes, Smokeless Tobacco, Cigars, and/or Pipes): Yes Tobacco use, Select all that apply: 5 or more cigarettes per day Are you interested in Tobacco Cessation Medications?: Yes, will notify MD for an order Counseled patient on smoking cessation including recognizing danger situations, developing coping skills and basic information about quitting provided: Refused/Declined practical counseling Social History:  Social History   Substance and Sexual Activity  Alcohol Use Yes   Comment: occasional     Social History   Substance and Sexual Activity  Drug Use Yes  . Types: "Crack" cocaine, Opium    Additional Social History: Marital status: Divorced    Pain Medications: Pt has a history of abusing pain medications Prescriptions: Pt has a history of abusing pain medications Over the Counter: Denies abuse History of alcohol / drug use?: Yes Longest period of sobriety (when/how long): 2 years Negative Consequences of Use: Financial, Personal relationships, Work / School Withdrawal Symptoms: Nausea / Vomiting, Sweats, Cramps, Tremors Name of Substance 1: Heroin (I.V.) 1 - Age of First Use: 49 1 - Amount (size/oz): 2-10 bags 1 - Frequency: Daily 1 - Duration: four years  1 - Last Use / Amount: 02/04/2019 Name of Substance 2: Methamphetamines (I.V.) 2 - Age of First Use: 50 2 - Amount (size/oz): $10-20 worth 2 - Frequency: Daily 2 - Duration: Three years 2 - Last Use / Amount: 02/04/2019                Allergies:  No Known Allergies Lab Results: No results found for this or any previous visit (from the past 48 hour(s)).  Blood Alcohol level:  Lab Results  Component Value Date   ETH <10 32/35/5732    Metabolic Disorder Labs:  No results found for: HGBA1C, MPG No results found for: PROLACTIN No results found for: CHOL, TRIG, HDL, CHOLHDL, VLDL, LDLCALC  Current Medications: Current  Facility-Administered Medications  Medication Dose Route Frequency Provider Last Rate Last Dose  . acetaminophen (TYLENOL) tablet 650 mg  650 mg Oral Q6H PRN Lindon Romp A, NP   650 mg at 02/05/19 1123  . alum & mag hydroxide-simeth (MAALOX/MYLANTA) 200-200-20 MG/5ML suspension 30 mL  30 mL Oral Q4H PRN Lindon Romp A, NP      . amLODipine (NORVASC) tablet 5 mg  5 mg Oral Daily Lindon Romp A, NP   5 mg at 02/05/19 0819  . cloNIDine (CATAPRES) tablet 0.1 mg  0.1 mg Oral QID Lindon Romp A, NP   0.1 mg at 02/05/19 1118   Followed by  . [START ON 02/07/2019] cloNIDine (CATAPRES) tablet 0.1 mg  0.1 mg Oral BH-qamhs Rozetta Nunnery, NP       Followed by  . [START ON 02/09/2019] cloNIDine (CATAPRES) tablet 0.1 mg  0.1 mg Oral QAC breakfast Lindon Romp A, NP      . dicyclomine (BENTYL) tablet 20 mg  20 mg Oral Q6H PRN Lindon Romp A, NP   20 mg at 02/05/19 1123  . feeding supplement (ENSURE ENLIVE) (ENSURE ENLIVE) liquid 237 mL  237 mL Oral BID BM Boby Eyer, Myer Peer, MD   237 mL at 02/05/19 2025  . hydrOXYzine (ATARAX/VISTARIL) tablet 25 mg  25 mg Oral Q6H PRN Lindon Romp A, NP   25 mg at 02/05/19 1124  . loperamide (IMODIUM) capsule 2-4 mg  2-4 mg Oral PRN Lindon Romp A, NP      . magnesium hydroxide (MILK OF MAGNESIA) suspension 30 mL  30 mL Oral Daily PRN Lindon Romp A, NP      . methocarbamol (ROBAXIN) tablet 500 mg  500 mg Oral Q8H PRN Lindon Romp A, NP   500 mg at 02/05/19 0356  . multivitamin with minerals tablet 1 tablet  1 tablet Oral Daily Kaliyan Osbourn A, MD      . naproxen (NAPROSYN) tablet 500 mg  500 mg Oral BID PRN Lindon Romp A, NP   500 mg at 02/05/19 0356  . nicotine (NICODERM CQ - dosed in mg/24 hours) patch 21 mg  21 mg Transdermal Daily Link Burgeson, Myer Peer, MD   21 mg at 02/05/19 0820  . ondansetron (ZOFRAN-ODT) disintegrating tablet 4 mg  4 mg Oral Q6H PRN Lindon Romp A, NP   4 mg at 02/05/19 1123  . sertraline (ZOLOFT) tablet 50 mg  50 mg Oral Daily Pratt Bress A, MD       . traZODone (DESYREL) tablet 50 mg  50 mg Oral QHS PRN Lindon Romp A, NP       PTA Medications: No medications prior to admission.    Musculoskeletal: Strength & Muscle Tone: within normal limits Gait & Station: normal Patient  leans: N/A  Psychiatric Specialty Exam: Physical Exam  Nursing note and vitals reviewed. Constitutional: He is oriented to person, place, and time. He appears well-developed and well-nourished.  Cardiovascular: Normal rate.  Respiratory: Effort normal.  Neurological: He is alert and oriented to person, place, and time.    Review of Systems  Constitutional: Positive for chills, diaphoresis and malaise/fatigue. Negative for fever.  Respiratory: Negative for cough and shortness of breath.   Cardiovascular: Negative for chest pain.  Gastrointestinal: Positive for nausea. Negative for abdominal pain, diarrhea and vomiting.  Musculoskeletal: Positive for myalgias.  Neurological: Positive for headaches. Negative for tremors and sensory change.  Psychiatric/Behavioral: Positive for depression, substance abuse and suicidal ideas. Negative for hallucinations. The patient is nervous/anxious and has insomnia.     Blood pressure 127/89, pulse 94, temperature 98 F (36.7 C), temperature source Oral, resp. rate 20, height 6' (1.829 m), weight 74.8 kg, SpO2 98 %.Body mass index is 22.38 kg/m.  General Appearance: Disheveled  Eye Contact:  Poor  Speech:  Slow  Volume:  Decreased  Mood:  Depressed  Affect:  Congruent  Thought Process:  Coherent  Orientation:  Full (Time, Place, and Person)  Thought Content:  Logical  Suicidal Thoughts:  Yes.  with intent/plan thoughts of overdosing on heroin. Denies suicidal plan or intent on the unit.  Homicidal Thoughts:  No  Memory:  Immediate;   Fair Recent;   Fair Remote;   Fair  Judgement:  Fair  Insight:  Fair  Psychomotor Activity:  Normal  Concentration:  Concentration: Fair and Attention Span: Fair  Recall:  [employer] of Knowledge:  Fair  Language:  Fair  Akathisia:  No  Handed:  Right  AIMS (if indicated):     Assets:  Communication Skills Desire for Improvement Leisure Time Resilience  ADL's:  Intact  Cognition:  WNL  Sleep:  Number of Hours: 2.75    Treatment Plan Summary: Daily contact with patient to assess and evaluate symptoms and progress in treatment and Medication management   Inpatient hospitalization.  See MD's admission SRA for medication management.  Patient will participate in the therapeutic group milieu.  Discharge disposition in progress.   Observation Level/Precautions:  15 minute checks  Laboratory:  Reviewed  Psychotherapy:  Group therapy  Medications:  See MAR  Consultations:  PRN  Discharge Concerns:  Safety and stabilization  Estimated LOS: 3-5 days  Other:     Physician Treatment Plan for Primary Diagnosis: <principal problem not specified> Long Term Goal(s): Improvement in symptoms so as ready for discharge  Short Term Goals: Ability to identify changes in lifestyle to reduce recurrence of condition will improve, Ability to verbalize feelings will improve and Ability to disclose and discuss suicidal ideas  Physician Treatment Plan for Secondary Diagnosis: Active Problems:   Severe recurrent major depression without psychotic features ([place])   Opioid dependence with opioid-induced mood disorder ([place])  Long Term Goal(s): Improvement in symptoms so as ready for discharge  Short Term Goals: Ability to demonstrate self-control will improve and Ability to identify and develop effective coping behaviors will improve  I certify that inpatient services furnished can reasonably be expected to improve the patient's condition.    Connye Burkitt, NP 10/19/20202:07 PM   I have discussed case with NP and have met with patient  Agree with NP note and assessment  53, single, has three adult children, currently homeless, had been living with his father  recently. Presented to [facility] ED voluntarily reporting depression,  substance dependence. He states he had developed violent ideations towards the man who has been selling him drugs , mainly thinking of robbing him for drugs. States he came close to actually carrying this out but decided against it. States " I am not that kind of person" and felt his addiction was " out of control". Patient reports depression and presents with a constricted affect. He endorses neuro-vegetative symptoms of depression to include poor sleep, low energy level, poor appetite anhedonia, suicidal ideations, with recent thoughts of overdosing on opiates .States that 2 weeks ago had attempted suicide by overdosing on heroin. Did not seek treatment at the time. Reports history of substance abuse, has been using heroin intravenously daily, as well as methamphetamine several times a week. Denies alcohol or BZD use . Reports last used drugs yesterday. Reports one prior psychiatric admission for depression in the context of marital discord.History of prior suicide attempts. Did not seek treatment at the time. Denies history of mania or of psychosis, denies history of PTSD.Does endorse history of anxiety/occasional panic attacks. Denies history of violence . History of HTN, NKDA, reports was not taking any medications for several months prior to admission. Paternal Barbaraann Rondo has history of substance abuse and two other uncles have committed suicide   Currently reports some symptoms of opiate WDL- aches, myalgias, feeling " hot and cold", restless, nausea. No vomiting or diarrhea at this time.  Dx- Opiate Dependence, Methamphetamine Depedence, Substance Induced Mood Disorder versus MDD    Plan- Inpatient admission. We discussed treatment options, agrees to antidepressant trial. Start Zoloft 50 mgrs QDAY- side effects reviewed. Opiate detox ( Clonidine )  protocol for opiate WDL

## 2019-02-05 NOTE — [facility] Group Notes (Signed)
LCSW Group Therapy Note 02/05/2019 1:13 PM  Type of Therapy and Topic: Group Therapy: Overcoming Obstacles  Participation Level: Did Not Attend  Description of Group:  In this group patients will be encouraged to explore what they see as obstacles to their own wellness and recovery. They will be guided to discuss their thoughts, feelings, and behaviors related to these obstacles. The group will process together ways to cope with barriers, with attention given to specific choices patients can make. Each patient will be challenged to identify changes they are motivated to make in order to overcome their obstacles. This group will be process-oriented, with patients participating in exploration of their own experiences as well as giving and receiving support and challenge from other group members.  Therapeutic Goals: 1. Patient will identify personal and current obstacles as they relate to admission. 2. Patient will identify barriers that currently interfere with their wellness or overcoming obstacles.  3. Patient will identify feelings, thought process and behaviors related to these barriers. 4. Patient will identify two changes they are willing to make to overcome these obstacles:   Summary of Patient Progress  Invited, chose not to attend.    Therapeutic Modalities:  Cognitive Behavioral Therapy Solution Focused Therapy Motivational Interviewing Relapse Prevention Therapy   Theresa Duty Clinical Social Worker

## 2019-02-05 NOTE — Progress Notes (Signed)
Spiritual care group on grief and loss facilitated by chaplain Jerene Pitch MDiv, [facility]  Group Goal:  Support / Education around grief and loss Members engage in facilitated group support and psycho-social education.  Group Description:  Following introductions and group rules, group members engaged in facilitated group dialog and support around topic of loss, with particular support around experiences of loss in their lives. Group Identified types of loss (relationships / self / things) and identified patterns, circumstances, and changes that precipitate losses. Reflected on thoughts / feelings around loss, normalized grief responses, and recognized variety in grief experience. Patient Progress:   DID NOT ATTEND

## 2019-02-05 NOTE — B[facility] Counselor (Signed)
CSW unable to complete assessment at this time. Patient reports he does not feel well and feels as if he is going to pass out. RN made aware.  CSW will attempt to complete assessment at a later time.   Radonna Ricker, MSW, LCSW Clinical Social Worker [facility]  Phone: [phone]

## 2019-02-05 NOTE — Progress Notes (Signed)
Admission note:  Pt is a 53 year old Caucasian male admitted to the care of Dr. Parke Poisson for treatment of opiate addiction, depression, and suicidal ideation.  Pt states that he has been anywhere from two to ten bags of heroin a day, and up to $20 worth of methamphetamine.  Pt reports last use of both [place] this past morning.  Pt is cooperative with admission process and appears to be withdrawing minimally at this time.  Pt does report suicidal ideation with an attempt two weeks ago by heroin overdose.  He was administered narcan by friend that was present.  Pt states when asked his goal "I'm not dead yet".  Pt reports that his addiction is his main stressor.  Pt shown to room and oriented to unit.

## 2019-02-05 NOTE — Progress Notes (Signed)
D:  Patient denied SI and HI, contracts for safety.  Denied A/V hallucinations. A:  Medications administered per MD orders.  Emotional support and encouragement given patient. R:  Safety maintained with 15 minute checks. Patient has been in bed most of the day.

## 2019-02-06 DIAGNOSIS — F1124 Opioid dependence with opioid-induced mood disorder: Secondary | ICD-10-CM

## 2019-02-06 LAB — T4, FREE: Free T4: 0.94 ng/dL (ref 0.61–1.12)

## 2019-02-06 LAB — HEMOGLOBIN A1C
Hgb A1c MFr Bld: 5.6 % (ref 4.8–5.6)
Mean Plasma Glucose: 114 mg/dL

## 2019-02-06 MED ORDER — TRAZODONE HCL 100 MG PO TABS
100.0000 mg | ORAL_TABLET | Freq: Every evening | ORAL | Status: DC | PRN
  Administered 2019-02-06: 22:00:00 100 mg via ORAL
  Filled 2019-02-06 (×2): qty 1

## 2019-02-06 NOTE — Progress Notes (Signed)
[facility] MD Progress Note  02/06/2019 10:35 AM Theodore Phelps  MRN:  [id number] Subjective:  "I just feel like shit."  Theodore Phelps found lying in bed. He complains of withdrawal symptoms this morning- generalized aches, diaphoresis, chills, restlessness, diarrhea, reduced appetite. He also reports continued difficulty sleeping overnight after trazodone admin. He has been drinking fluids. VSS. Last COWS 8. He is interested in rehab after discharge but advised that options are limited right now due to COVID-19 and states understanding. He denies SI/HI/AVH. He specifically denies HI toward his drug dealer. Labs reviewed- TSH is 0.136.  From admission H&P: Theodore Phelps is a 53 year old male with history of polysubstance dependence, hep C, HTN, GERD, renal cell carcinoma s/p L nephrectomy, presenting for treatment of suicidal ideation and homicidal ideation toward his drug dealer. He presents with depressed affect and minimal speech. He reports daily IV use of heroin and use of meth every other day for weeks, with last use of both yesterday.  Principal Problem: <principal problem not specified> Diagnosis: Active Problems:   Severe recurrent major depression without psychotic features (HCC)   Opioid dependence with opioid-induced mood disorder (HCC)  Total Time spent with patient: 15 minutes  Past Psychiatric History: See admission H&P  Past Medical History:  Past Medical History:  Diagnosis Date  . Abdominal hernia    right   . Cancer ([place])    Left kidney  . GERD (gastroesophageal reflux disease)   . Hepatitis    C  . Hypertension   . Inguinal hernia    Left    Past Surgical History:  Procedure Laterality Date  . CHOLECYSTECTOMY    . HERNIA REPAIR    . INGUINAL HERNIA REPAIR Left 01/19/2017   Procedure: LEFT HERNIA REPAIR INGUINAL ADULT;  Surgeon: Erroll Luna, MD;  Location: [place];  Service: General;  Laterality: Left;  . INSERTION OF MESH Left 01/19/2017   Procedure: INSERTION OF MESH;   Surgeon: Erroll Luna, MD;  Location: [place];  Service: General;  Laterality: Left;  . NEPHRECTOMY     left   Family History:  Family History  Problem Relation Age of Onset  . Cancer Brother    Family Psychiatric  History: See admission H&P Social History:  Social History   Substance and Sexual Activity  Alcohol Use Yes   Comment: occasional     Social History   Substance and Sexual Activity  Drug Use Yes  . Types: "Crack" cocaine, Opium    Social History   Socioeconomic History  . Marital status: Married    Spouse name: Not on file  . Number of children: Not on file  . Years of education: Not on file  . Highest education level: Not on file  Occupational History  . Not on file  Social Needs  . Financial resource strain: Patient refused  . Food insecurity    Worry: Patient refused    Inability: Patient refused  . Transportation needs    Medical: Patient refused    Non-medical: Patient refused  Tobacco Use  . Smoking status: Current Every Day Smoker    Packs/day: 0.25    Types: Cigarettes  . Smokeless tobacco: Former Systems developer    Types: Snuff, Chew  Substance and Sexual Activity  . Alcohol use: Yes    Comment: occasional  . Drug use: Yes    Types: "Crack" cocaine, Opium  . Sexual activity: Not Currently  Lifestyle  . Physical activity    Days per week: Patient refused  Minutes per session: Patient refused  . Stress: Patient refused  Relationships  . Social Herbalist on phone: Patient refused    Gets together: Patient refused    Attends religious service: Patient refused    Active member of club or organization: Patient refused    Attends meetings of clubs or organizations: Patient refused    Relationship status: Patient refused  Other Topics Concern  . Not on file  Social History Narrative  . Not on file   Additional Social History:    Pain Medications: Pt has a history of abusing pain medications Prescriptions: Pt has a history of  abusing pain medications Over the Counter: Denies abuse History of alcohol / drug use?: Yes Longest period of sobriety (when/how long): 2 years Negative Consequences of Use: Financial, Personal relationships, Work / School Withdrawal Symptoms: Nausea / Vomiting, Sweats, Cramps, Tremors Name of Substance 1: Heroin (I.V.) 1 - Age of First Use: 49 1 - Amount (size/oz): 2-10 bags 1 - Frequency: Daily 1 - Duration: four years 1 - Last Use / Amount: 02/04/2019 Name of Substance 2: Methamphetamines (I.V.) 2 - Age of First Use: 50 2 - Amount (size/oz): $10-20 worth 2 - Frequency: Daily 2 - Duration: Three years 2 - Last Use / Amount: 02/04/2019                Sleep: Poor  Appetite:  Poor  Current Medications: Current Facility-Administered Medications  Medication Dose Route Frequency Provider Last Rate Last Dose  . acetaminophen (TYLENOL) tablet 650 mg  650 mg Oral Q6H PRN Lindon Romp A, NP   650 mg at 02/05/19 1123  . alum & mag hydroxide-simeth (MAALOX/MYLANTA) 200-200-20 MG/5ML suspension 30 mL  30 mL Oral Q4H PRN Lindon Romp A, NP      . amLODipine (NORVASC) tablet 5 mg  5 mg Oral Daily Lindon Romp A, NP   5 mg at 02/06/19 0837  . cloNIDine (CATAPRES) tablet 0.1 mg  0.1 mg Oral QID Lindon Romp A, NP   0.1 mg at 02/06/19 [id number]   Followed by  . [START ON 02/07/2019] cloNIDine (CATAPRES) tablet 0.1 mg  0.1 mg Oral BH-qamhs Rozetta Nunnery, NP       Followed by  . [START ON 02/09/2019] cloNIDine (CATAPRES) tablet 0.1 mg  0.1 mg Oral QAC breakfast Lindon Romp A, NP      . dicyclomine (BENTYL) tablet 20 mg  20 mg Oral Q6H PRN Lindon Romp A, NP   20 mg at 02/05/19 1123  . feeding supplement (ENSURE ENLIVE) (ENSURE ENLIVE) liquid 237 mL  237 mL Oral BID BM Cobos, Myer Peer, MD   237 mL at 02/05/19 0822  . hydrOXYzine (ATARAX/VISTARIL) tablet 25 mg  25 mg Oral Q6H PRN Lindon Romp A, NP   25 mg at 02/06/19 [id number]  . loperamide (IMODIUM) capsule 2-4 mg  2-4 mg Oral PRN Lindon Romp A,  NP   4 mg at 02/05/19 2103  . magnesium hydroxide (MILK OF MAGNESIA) suspension 30 mL  30 mL Oral Daily PRN Lindon Romp A, NP      . methocarbamol (ROBAXIN) tablet 500 mg  500 mg Oral Q8H PRN Lindon Romp A, NP   500 mg at 02/05/19 2103  . multivitamin with minerals tablet 1 tablet  1 tablet Oral Daily Cobos, Myer Peer, MD   1 tablet at 02/06/19 0837  . naproxen (NAPROSYN) tablet 500 mg  500 mg Oral BID PRN Rozetta Nunnery, NP  500 mg at 02/05/19 0356  . nicotine (NICODERM CQ - dosed in mg/24 hours) patch 21 mg  21 mg Transdermal Daily Cobos, Myer Peer, MD   21 mg at 02/06/19 [id number]  . ondansetron (ZOFRAN-ODT) disintegrating tablet 4 mg  4 mg Oral Q6H PRN Lindon Romp A, NP   4 mg at 02/05/19 1123  . sertraline (ZOLOFT) tablet 50 mg  50 mg Oral Daily Cobos, Myer Peer, MD   50 mg at 02/06/19 0837  . traZODone (DESYREL) tablet 100 mg  100 mg Oral QHS PRN,MR X 1 Connye Burkitt, NP        Lab Results:  Results for orders placed or performed during the hospital encounter of 02/04/19 (from the past 48 hour(s))  Hemoglobin A1c     Status: None   Collection Time: 02/05/19  6:33 PM  Result Value Ref Range   Hgb A1c MFr Bld 5.6 4.8 - 5.6 %    Comment: (NOTE)         Prediabetes: 5.7 - 6.4         Diabetes: >6.4         Glycemic control for adults with diabetes: <7.0    Mean Plasma Glucose 114 mg/dL    Comment: (NOTE) Performed At: [facility] [place], [place] [id number] Rush Farmer MD [id number]   Lipid panel     Status: Abnormal   Collection Time: 02/05/19  6:33 PM  Result Value Ref Range   Cholesterol 185 0 - 200 mg/dL   Triglycerides 104 <150 mg/dL   HDL 46 >40 mg/dL   Total CHOL/HDL Ratio 4.0 RATIO   VLDL 21 0 - 40 mg/dL   LDL Cholesterol 118 (H) 0 - 99 mg/dL    Comment:        Total Cholesterol/HDL:CHD Risk Coronary Heart Disease Risk Table                     Men   Women  1/2 Average Risk   3.4   3.3  Average Risk       5.0   4.4  2 X Average  Risk   9.6   7.1  3 X Average Risk  23.4   11.0        Use the calculated Patient Ratio above and the CHD Risk Table to determine the patient's CHD Risk.        ATP III CLASSIFICATION (LDL):  <100     mg/dL   Optimal  100-129  mg/dL   Near or Above                    Optimal  130-159  mg/dL   Borderline  160-189  mg/dL   High  >190     mg/dL   Very High Performed at [facility] [address], [place], [place] [address]   TSH     Status: Abnormal   Collection Time: 02/05/19  6:33 PM  Result Value Ref Range   TSH 0.136 (L) 0.350 - 4.500 uIU/mL    Comment: Performed by a 3rd Generation assay with a functional sensitivity of <=0.01 uIU/mL. Performed at [facility], [place] [address], [place], [place] [address]     Blood Alcohol level:  Lab Results  Component Value Date   [facility] <10 [id number]    Metabolic Disorder Labs: Lab Results  Component Value Date   HGBA1C 5.6 02/05/2019   MPG 114  02/05/2019   No results found for: PROLACTIN Lab Results  Component Value Date   CHOL 185 02/05/2019   TRIG 104 02/05/2019   HDL 46 02/05/2019   CHOLHDL 4.0 02/05/2019   VLDL 21 02/05/2019   LDLCALC 118 (H) 02/05/2019    Physical Findings: AIMS: Facial and Oral Movements Muscles of Facial Expression: None, normal Lips and Perioral Area: None, normal Jaw: None, normal Tongue: None, normal,Extremity Movements Upper (arms, wrists, hands, fingers): None, normal Lower (legs, knees, ankles, toes): None, normal, Trunk Movements Neck, shoulders, hips: None, normal, Overall Severity Severity of abnormal movements (highest score from questions above): None, normal Incapacitation due to abnormal movements: None, normal Patient's awareness of abnormal movements (rate only patient's report): No Awareness, Dental Status Current problems with teeth and/or dentures?: No Does patient usually wear dentures?: No  CIWA:  CIWA-Ar Total: 1 COWS:  COWS Total  Score: 8  Musculoskeletal: Strength & Muscle Tone: within normal limits Gait & Station: normal Patient leans: N/A  Psychiatric Specialty Exam: Physical Exam  Nursing note and vitals reviewed. Constitutional: He is oriented to person, place, and time. He appears well-developed and well-nourished.  Cardiovascular: Normal rate.  Respiratory: Effort normal.  Neurological: He is alert and oriented to person, place, and time.    Review of Systems  Constitutional: Positive for chills, diaphoresis and malaise/fatigue. Negative for fever.  Respiratory: Negative for cough and shortness of breath.   Cardiovascular: Negative for chest pain.  Gastrointestinal: Positive for abdominal pain and diarrhea. Negative for nausea and vomiting.  Musculoskeletal: Positive for myalgias.  Neurological: Positive for headaches. Negative for tremors and sensory change.  Psychiatric/Behavioral: Positive for depression and substance abuse. Negative for hallucinations and suicidal ideas. The patient has insomnia. The patient is not nervous/anxious.     Blood pressure 96/82, pulse (!) 102, temperature 98.5 F (36.9 C), resp. rate 20, height 6' (1.829 m), weight 74.8 kg, SpO2 93 %.Body mass index is 22.38 kg/m.  General Appearance: Disheveled  Eye Contact:  Poor  Speech:  Slow  Volume:  Decreased  Mood:  Dysphoric  Affect:  Congruent  Thought Process:  Coherent  Orientation:  Full (Time, Place, and Person)  Thought Content:  Logical  Suicidal Thoughts:  No  Homicidal Thoughts:  No  Memory:  Immediate;   Fair Recent;   Fair  Judgement:  Intact  Insight:  Fair  Psychomotor Activity:  Decreased  Concentration:  Concentration: Fair and Attention Span: Fair  Recall:  [employer] of Knowledge:  Fair  Language:  Good  Akathisia:  No  Handed:  Right  AIMS (if indicated):     Assets:  Communication Skills Desire for Improvement Financial Resources/Insurance Resilience  ADL's:  Intact  Cognition:  WNL   Sleep:  Number of Hours: 6.5     Treatment Plan Summary: Daily contact with patient to assess and evaluate symptoms and progress in treatment and Medication management   Continue inpatient hospitalization. Check T3, T4.  Increase trazodone to 100 mg PO QHS PRN insomnia Continue clonidine COWS protocol for opioid withdrawal Continue Norvasc 5 mg PO daily for HTN Continue Vistaril 25 mg PO Q6HR PRN anxiety Continue Zoloft 50 mg PO daily for mood  Patient will participate in the therapeutic group milieu.  Discharge disposition in progress.   Connye Burkitt, NP 02/06/2019, 10:35 AM

## 2019-02-06 NOTE — Progress Notes (Signed)
Patient did not attend wrap up group because he was asleep. 

## 2019-02-06 NOTE — Care Management (Signed)
CMA sent referral to [facility].   CMA will follow up.   CMA will notify CSW.     Jadee Golebiewski Care Management Assistant  Email:[email or web address] Office: [phone]

## 2019-02-06 NOTE — Progress Notes (Signed)
Recreation Therapy Notes  Animal-Assisted Activity (AAA) Program Checklist/Progress Notes Patient Eligibility Criteria Checklist & Daily Group note for Rec Tx Intervention  Date: 10.20.20 Time: 61 Location: [address]   AAA/T Program Assumption of Risk Form signed by Teacher, music or Parent Legal Guardian  YES   Patient is free of allergies or sever asthma  YES   Patient reports no fear of animals  YES   Patient reports no history of cruelty to animals  YES   Patient understands his/her participation is voluntary  YES  Patient washes hands before animal contact  YES  Patient washes hands after animal contact  YES   Education: Contractor, Appropriate Animal Interaction   Education Outcome: Acknowledges understanding/In group clarification offered/Needs additional education.   Clinical Observations/Feedback:  Pt did not attend group activity.     Victorino Sparrow, LRT/CTRS         Victorino Sparrow A 02/06/2019 3:26 PM

## 2019-02-06 NOTE — BHH Counselor (Signed)
Adult Comprehensive Assessment  Patient ID: Ahmad Magann, male   DOB: [birth year], 53 y.o.   MRN: [id number]  Information Source: Information source: Patient  Current Stressors:  Patient states their primary concerns and needs for treatment are:: SI and HI (toward his drug dealer) Patient states their goals for this hospitilization and ongoing recovery are:: "Trying to stay clean." Educational / Learning stressors: Denies Employment / Job issues: Reports he was fired prior to admission. Family Relationships: Strained relationship with father. His kids don't talk to him. Financial / Lack of resources (include bankruptcy): No income, currently has [employer] / Lack of housing: Essentially homeless at discharge as he believes his father will not let him come home. Physical health (include injuries & life threatening diseases): Endorses health issues, he is concerned about having another hernia surgery. Social relationships: Denies Substance abuse: Uses meth and heroin IV daily. Bereavement / Loss: Brother died of cancer a couple years ago.  Living/Environment/Situation:  Living Arrangements: Parent Living conditions (as described by patient or guardian): Single family home in [place], [place] Who else lives in the home?: Father How long has patient lived in current situation?: "Off and on for a couple years." What is atmosphere in current home: Temporary  Family History:  Marital status: Single Are you sexually active?: No What is your sexual orientation?: Straight Has your sexual activity been affected by drugs, alcohol, medication, or emotional stress?: Denies Does patient have children?: Yes How many children?: 3 How is patient's relationship with their children?: 2 step children from a previous marriage, 1 child. "They don't talk to me."  Childhood History:  By whom was/is the patient raised?: Mother Additional childhood history information: None Description of patient's  relationship with caregiver when they were a child: Closer to mother Patient's description of current relationship with people who raised him/her: Strained with father, gets along well with his mother. How were you disciplined when you got in trouble as a child/adolescent?: Appropriate physical discipline Does patient have siblings?: Yes Number of Siblings: 2 Description of patient's current relationship with siblings: 1 sister, okay relationship but mother is busy caring for their mother. 1 brother- deceased. Did patient suffer any verbal/emotional/physical/sexual abuse as a child?: No Did patient suffer from severe childhood neglect?: No Has patient ever been sexually abused/assaulted/raped as an adolescent or adult?: No Was the patient ever a victim of a crime or a disaster?: No Witnessed domestic violence?: No Has patient been effected by domestic violence as an adult?: No  Education:  Highest grade of school patient has completed: 10th grade Currently a student?: No Learning disability?: Yes What learning problems does patient have?: "I don't read or write well."  Employment/Work Situation:   Employment situation: Unemployed Patient's job has been impacted by current illness: Yes Describe how patient's job has been impacted: Lost employment due to substance use. What is the longest time patient has a held a job?: Not sure, but more than 5 years. Where was the patient employed at that time?: [employer] Did You Receive Any Psychiatric Treatment/Services While in the [place]?: No Are There Guns or Other Weapons in [place]?: No  Financial Resources:   Museum/gallery curator resources: Multimedia programmer Does patient have a representative payee or guardian?: No  Alcohol/Substance Abuse:   What has been your use of drugs/alcohol within the last 12 months?: Daily heroin and meth use. Alcohol/Substance Abuse Treatment Hx: Past Tx, Inpatient, Past detox If yes, describe treatment:  [facility] for detox and treatment in 2019. Has  alcohol/substance abuse ever caused legal problems?: No  Social Support System:   Heritage manager System: Poor Describe Community Support System: Sister, mother Type of faith/religion: "I believe in God." How does patient's faith help to cope with current illness?: Unsure  Leisure/Recreation:   Leisure and Hobbies: "I don't have any."  Strengths/Needs:   What is the patient's perception of their strengths?: "I don't know right now." Patient states they can use these personal strengths during their treatment to contribute to their recovery: Unsure Patient states these barriers may affect their return to the community: Unsure about discharge plan  Discharge Plan:   Currently receiving community mental health services: No Patient states concerns and preferences for aftercare planning are: Unsure if he wants residential treatment or outpatient. Agreeable to referrals. Patient states they will know when they are safe and ready for discharge when: Unsure Does patient have access to transportation?: Yes Does patient have financial barriers related to discharge medications?: Yes Patient description of barriers related to discharge medications: Has private insurance through the end of month, no income. Plan for living situation after discharge: Residential treatment or potentially sober living house Will patient be returning to same living situation after discharge?: No  Summary/Recommendations:   Summary and Recommendations (to be completed by the evaluator): Simcha is a 53 year old male from [place] who presents to [facility] voluntarily. [place] endorses SI and HI, he endorses polysubstance abuse including daily IV use of heroin and meth. Patient also endorses strained relationships with family and recently losing his job. Patient will benefit from crisis stabilization, medication management, therapeutic milieu, and referrals for  services.  Joellen Jersey. 02/06/2019

## 2019-02-06 NOTE — BHH Suicide Risk Assessment (Signed)
[place] INPATIENT:  Family/Significant Other Suicide Prevention Education  Suicide Prevention Education:  Education Completed; with sister, Pola Corn ([id number]) has been identified by the patient as the family member/significant other with whom the patient will be residing, and identified as the person(s) who will aid the patient in the event of a mental health crisis (suicidal ideations/suicide attempt).  With written consent from the patient, the family member/significant other has been provided the following suicide prevention education, prior to the and/or following the discharge of the patient.  The suicide prevention education provided includes the following:  Suicide risk factors  Suicide prevention and interventions  National Suicide Hotline telephone number  [facility] assessment telephone number  [facility] Emergency Assistance [place] and/or Residential Mobile Crisis Unit telephone number  Request made of family/significant other to:  Remove weapons (e.g., guns, rifles, knives), all items previously/currently identified as safety concern.    Remove drugs/medications (over-the-counter, prescriptions, illicit drugs), all items previously/currently identified as a safety concern.  The family member/significant other verbalizes understanding of the suicide prevention education information provided.  The family member/significant other agrees to remove the items of safety concern listed above.  Elmo Putt reports that the patient has participated in residential treatment in the past, which all were unsuccessful. She reports she believes the patient needs more long-term treatment due to the severity of his substance use addictions.   Elmo Putt shared that the patient current living arrangements with their father is "not the best". She shared that her father is a Ship broker and she suspects that there may be weapons in the home, however she is not sure. She  also reports her father's home is in poor condition.   CSW informed the patient's sister that the patient has been referred to residential treatment facilities for possible placement. She expressed understanding and asked to be updated if the patient was placed. CSW will continue to follow.   Marylee Floras 02/06/2019, 2:06 PM

## 2019-02-06 NOTE — Progress Notes (Signed)
Patient ID: Theodore Phelps, male   DOB: Sep 11, 1965, 53 y.o.   MRN: [id number] D: Patient in his room on approach. Pt appears restless asking for medication to sleep. Pt c/o generalized body pain, diarrhea and anxiety. Denies  SI/HI/AVH. No acute distress noted.  A: Support and encouragement offered as needed. Snack and Gatorade offered/encouraged. Medications administered as prescribed.  R: Patient is safe and cooperative on unit. Will continue to monitor  for safety and stability.

## 2019-02-06 NOTE — Progress Notes (Signed)
Patient denies SI, HI and AVH.  Patient has had no incidents of behavioral dsycontrol this shift.  Patient has been compliant with all medications, attended groups and engaged in unit activities.   Assess patient for safety, offer medications as prescribed, engage patient in 1:1 therapeutic staff talks.   Continue to monitor patient as planned.  Patient able to contract for safety.

## 2019-02-07 DIAGNOSIS — F1994 Other psychoactive substance use, unspecified with psychoactive substance-induced mood disorder: Secondary | ICD-10-CM

## 2019-02-07 LAB — T3, FREE: T3, Free: 1.7 pg/mL — ABNORMAL LOW (ref 2.0–4.4)

## 2019-02-07 MED ORDER — AMLODIPINE BESYLATE 5 MG PO TABS: 5.0000 mg | ORAL_TABLET | Freq: Every day | ORAL | 0 refills | Status: AC

## 2019-02-07 MED ORDER — SERTRALINE HCL 50 MG PO TABS: 50.0000 mg | ORAL_TABLET | Freq: Every day | ORAL | 0 refills | Status: AC

## 2019-02-07 MED ORDER — HYDROXYZINE HCL 25 MG PO TABS: 25.0000 mg | ORAL_TABLET | Freq: Four times a day (QID) | ORAL | 0 refills | Status: AC | PRN

## 2019-02-07 MED ORDER — TRAZODONE HCL 100 MG PO TABS: 100.0000 mg | ORAL_TABLET | Freq: Every evening | ORAL | 0 refills | Status: AC | PRN

## 2019-02-07 MED ORDER — NICOTINE 21 MG/24HR TD PT24: 21.0000 mg | MEDICATED_PATCH | Freq: Every day | TRANSDERMAL | 0 refills | Status: AC

## 2019-02-07 NOTE — BHH Suicide Risk Assessment (Signed)
[facility] Discharge Suicide Risk Assessment   Principal Problem: <principal problem not specified> Discharge Diagnoses: Active Problems:   Severe recurrent major depression without psychotic features (HCC)   Opioid dependence with opioid-induced mood disorder (HCC)   Total Time spent with patient: 20 minutes  Musculoskeletal: Strength & Muscle Tone: within normal limits Gait & Station: normal Patient leans: N/A  Psychiatric Specialty Exam: Review of Systems  All other systems reviewed and are negative.   Blood pressure 93/70, pulse (!) 101, temperature 99 F (37.2 C), resp. rate 20, height 6' (1.829 m), weight 74.8 kg, SpO2 98 %.Body mass index is 22.38 kg/m.  General Appearance: Disheveled  Eye Contact::  Good  Speech:  Normal Rate409  Volume:  Normal  Mood:  Anxious  Affect:  Congruent  Thought Process:  Coherent and Descriptions of Associations: Intact  Orientation:  Full (Time, Place, and Person)  Thought Content:  Logical  Suicidal Thoughts:  No  Homicidal Thoughts:  No  Memory:  Immediate;   Fair Recent;   Fair Remote;   Fair  Judgement:  Intact  Insight:  Fair  Psychomotor Activity:  Increased  Concentration:  Fair  Recall:  [employer] of Knowledge:Good  Language: Good  Akathisia:  Negative  Handed:  Right  AIMS (if indicated):     Assets:  Desire for Improvement Resilience  Sleep:  Number of Hours: 6.5  Cognition: WNL  ADL's:  Intact   Mental Status Per Nursing Assessment::   On Admission:  Suicidal ideation indicated by patient, Suicidal ideation indicated by others, Self-harm thoughts, Self-harm behaviors  Demographic Factors:  Male, Divorced or widowed, Caucasian and Low socioeconomic status  Loss Factors: NA  Historical Factors: Impulsivity  Risk Reduction Factors:   Sense of responsibility to family  Continued Clinical Symptoms:  Depression:   Comorbid alcohol abuse/dependence Impulsivity Alcohol/Substance Abuse/Dependencies  Cognitive  Features That Contribute To Risk:  None    Suicide Risk:  Minimal: No identifiable suicidal ideation.  Patients presenting with no risk factors but with morbid ruminations; may be classified as minimal risk based on the severity of the depressive symptoms  Follow-up [facility] Follow up.   Contact information: [place] [address] [phone]           Plan Of Care/Follow-up recommendations:  Activity:  ad lib  Sharma Covert, MD 02/07/2019, 8:52 AM

## 2019-02-07 NOTE — Discharge Summary (Signed)
Physician Discharge Summary Note  Patient:  Theodore Phelps is an 53 y.o., male MRN:  [id number] DOB:  1965-08-14 Patient phone:  [phone] (home)  Patient address:   [place] [address],  Total Time spent with patient: 15 minutes  Date of Admission:  02/04/2019 Date of Discharge: 02/07/19  Reason for Admission:  Heroin dependence with SI/HI toward drug dealer  Principal Problem: <principal problem not specified> Discharge Diagnoses: Active Problems:   Severe recurrent major depression without psychotic features (HCC)   Opioid dependence with opioid-induced mood disorder [facility])   Past Psychiatric History: History of polysubstance abuse since childhood, with multiple rehab admissions, most recently at [facility] in [facility]. One prior hospitalization for depression years ago related to conflict with his wife. Two prior suicide attempts, with most recent two weeks ago via overdose on heroin/meth. Denies history of aggressive behaviors, mania, or psychosis.   Past Medical History:  Past Medical History:  Diagnosis Date  . Abdominal hernia    right   . Cancer ([place])    Left kidney  . GERD (gastroesophageal reflux disease)   . Hepatitis    C  . Hypertension   . Inguinal hernia    Left    Past Surgical History:  Procedure Laterality Date  . CHOLECYSTECTOMY    . HERNIA REPAIR    . INGUINAL HERNIA REPAIR Left 01/19/2017   Procedure: LEFT HERNIA REPAIR INGUINAL ADULT;  Surgeon: Erroll Luna, MD;  Location: [facility];  Service: General;  Laterality: Left;  . INSERTION OF MESH Left 01/19/2017   Procedure: INSERTION OF MESH;  Surgeon: Erroll Luna, MD;  Location: [place];  Service: General;  Laterality: Left;  . NEPHRECTOMY     left   Family History:  Family History  Problem Relation Age of Onset  . Cancer Brother    Family Psychiatric  History: Two paternal uncles who died by suicide. Social History:  Social History   Substance and Sexual Activity  Alcohol Use Yes    Comment: occasional     Social History   Substance and Sexual Activity  Drug Use Yes  . Types: "Crack" cocaine, Opium    Social History   Socioeconomic History  . Marital status: Married    Spouse name: Not on file  . Number of children: Not on file  . Years of education: Not on file  . Highest education level: Not on file  Occupational History  . Not on file  Social Needs  . Financial resource strain: Patient refused  . Food insecurity    Worry: Patient refused    Inability: Patient refused  . Transportation needs    Medical: Patient refused    Non-medical: Patient refused  Tobacco Use  . Smoking status: Current Every Day Smoker    Packs/day: 0.25    Types: Cigarettes  . Smokeless tobacco: Former Systems developer    Types: Snuff, Chew  Substance and Sexual Activity  . Alcohol use: Yes    Comment: occasional  . Drug use: Yes    Types: "Crack" cocaine, Opium  . Sexual activity: Not Currently  Lifestyle  . Physical activity    Days per week: Patient refused    Minutes per session: Patient refused  . Stress: Patient refused  Relationships  . Social Herbalist on phone: Patient refused    Gets together: Patient refused    Attends religious service: Patient refused    Active member of club or organization: Patient refused  Attends meetings of clubs or organizations: Patient refused    Relationship status: Patient refused  Other Topics Concern  . Not on file  Social History Narrative  . Not on file    Hospital Course:  From admission H&P: Theodore Phelps is a 53 year old male with history of polysubstance dependence, hep C, HTN, GERD, renal cell carcinoma s/p L nephrectomy, presenting for treatment of suicidal ideation and homicidal ideation toward his drug dealer. He presents with depressed affect and minimal speech. He reports daily IV use of heroin and use of meth every other day for weeks, with last use of both yesterday. He reports recent overdoses, but the  people he was using with revived him with Narcan. He had run out of money for drugs and pursued his drug dealer with intent to hurt him so that he could steal the drugs. He did not hurt the drug dealer due to there being a child there, and he reports that he became afraid because he is not normally a violent person. He presented voluntarily to [facility] ED. He reports he has been staying with his father but was asked to leave recently related to drug use. He has long history of polysubstance dependence and reports his longest period of sobriety was two years, which was 10-15 years ago. He reports current withdrawal symptoms- nausea, muscle aches, diaphoresis, restless legs, and cold chills. UDS was positive for amphetamines and opioids. He reports SI to overdose on heroin. He denies current HI. Denies AVH. He denies history of aggressive behaviors.  Theodore Phelps was admitted for heroin dependence with suicidal ideation. He reported homicidal ideation toward his drug dealer so that he could steal drugs after running out of money. He remained on the [facility] unit for two days. He was started on clonidine COWS protocol for opioid withdrawal. Zoloft, trazodone and Vistaril were started. He responded well to treatment with no adverse effects reported. He has shown improved mood, affect, sleep, and interaction. He requested referrals for inpatient rehab. He has been accepted to [facility]. He denies any SI/HI/AVH and contracts for safety. He specifically denies any HI toward his drug dealer. He is discharging on the medications listed below. He is provided with prescriptions for medications upon discharge. He is discharging to [facility] via facility transportation.  Physical Findings: AIMS: Facial and Oral Movements Muscles of Facial Expression: None, normal Lips and Perioral Area: None, normal Jaw: None, normal Tongue: None, normal,Extremity Movements Upper (arms, wrists,  hands, fingers): None, normal Lower (legs, knees, ankles, toes): None, normal, Trunk Movements Neck, shoulders, hips: None, normal, Overall Severity Severity of abnormal movements (highest score from questions above): None, normal Incapacitation due to abnormal movements: None, normal Patient's awareness of abnormal movements (rate only patient's report): No Awareness, Dental Status Current problems with teeth and/or dentures?: No Does patient usually wear dentures?: No  CIWA:  CIWA-Ar Total: 1 COWS:  COWS Total Score: 4  Musculoskeletal: Strength & Muscle Tone: within normal limits Gait & Station: normal Patient leans: N/A  Psychiatric Specialty Exam: Physical Exam  Nursing note and vitals reviewed. Constitutional: He is oriented to person, place, and time. He appears well-developed and well-nourished.  Cardiovascular: Normal rate.  Respiratory: Effort normal.  Neurological: He is alert and oriented to person, place, and time.    Review of Systems  Constitutional: Negative.   Respiratory: Negative for cough and shortness of breath.   Cardiovascular: Negative for chest pain.  Psychiatric/Behavioral: Positive for depression (stable on medication) and  substance abuse. Negative for hallucinations and suicidal ideas. The patient is not nervous/anxious and does not have insomnia.     Blood pressure 112/77, pulse (!) 102, temperature 99 F (37.2 C), resp. rate 20, height 6' (1.829 m), weight 74.8 kg, SpO2 98 %.Body mass index is 22.38 kg/m.  See MD's discharge SRA    Have you used any form of tobacco in the last 30 days? (Cigarettes, Smokeless Tobacco, Cigars, and/or Pipes): Yes  Has this patient used any form of tobacco in the last 30 days? (Cigarettes, Smokeless Tobacco, Cigars, and/or Pipes) Yes, a prescription for an FDA-approved medication for tobacco cessation was offered at discharge.   Blood Alcohol level:  Lab Results  Component Value Date   ETH <10 [id number]     Metabolic Disorder Labs:  Lab Results  Component Value Date   HGBA1C 5.6 02/05/2019   MPG 114 02/05/2019   No results found for: PROLACTIN Lab Results  Component Value Date   CHOL 185 02/05/2019   TRIG 104 02/05/2019   HDL 46 02/05/2019   CHOLHDL 4.0 02/05/2019   VLDL 21 02/05/2019   LDLCALC 118 (H) 02/05/2019    See Psychiatric Specialty Exam and Suicide Risk Assessment completed by Attending Physician prior to discharge.  Discharge destination:  Home  Is patient on multiple antipsychotic therapies at discharge:  No   Has Patient had three or more failed trials of antipsychotic monotherapy by history:  No  Recommended Plan for Multiple Antipsychotic Therapies: NA  Discharge Instructions    Discharge instructions   Complete by: As directed    Patient is instructed to take all prescribed medications as recommended. Report any side effects or adverse reactions to your outpatient psychiatrist. Patient is instructed to abstain from alcohol and illegal drugs while on prescription medications. In the event of worsening symptoms, patient is instructed to call the crisis hotline, 911, or go to the nearest emergency department for evaluation and treatment.     Allergies as of 02/07/2019   No Known Allergies     Medication List    TAKE these medications     Indication  amLODipine 5 MG tablet Commonly known as: NORVASC Take 1 tablet (5 mg total) by mouth daily.  Indication: High Blood Pressure Disorder   hydrOXYzine 25 MG tablet Commonly known as: ATARAX/VISTARIL Take 1 tablet (25 mg total) by mouth every 6 (six) hours as needed for anxiety.  Indication: Feeling Anxious   nicotine 21 mg/24hr patch Commonly known as: NICODERM CQ - dosed in mg/24 hours Place 1 patch (21 mg total) onto the skin daily. Start taking on: February 08, 2019  Indication: Nicotine Addiction   sertraline 50 MG tablet Commonly known as: ZOLOFT Take 1 tablet (50 mg total) by mouth  daily. Start taking on: February 08, 2019  Indication: Major Depressive Disorder   traZODone 100 MG tablet Commonly known as: DESYREL Take 1 tablet (100 mg total) by mouth at bedtime as needed for sleep.  Indication: [facility] on 02/07/2019.   Why: You have been accepted for residential substance use treatment. The facility will provide transportation.  Contact information: [place] [address] [phone]           Follow-up recommendations: Activity as tolerated. Diet as recommended by primary care physician. Keep all scheduled follow-up appointments as recommended.   Comments:   Patient is instructed to take all prescribed medications as  recommended. Report any side effects or adverse reactions to your outpatient psychiatrist. Patient is instructed to abstain from alcohol and illegal drugs while on prescription medications. In the event of worsening symptoms, patient is instructed to call the crisis hotline, 911, or go to the nearest emergency department for evaluation and treatment.  Signed: Connye Burkitt, NP 02/07/2019, 2:51 PM

## 2019-02-07 NOTE — Progress Notes (Signed)
Patient ID: Theodore Phelps, male   DOB: [birth year], 53 y.o.   MRN: [id number]   D: Pt alert and oriented on the unit.   A: Education, support, and encouragement provided. Discharge summary, medications and follow up appointments reviewed with pt. Suicide prevention resources provided, including "My 3 App." Pt's belongings in locker # 69 returned and belongings sheet signed.  R: Pt denies SI/HI, A/VH, pain, or any concerns at this time. Pt ambulatory on and off unit. Pt discharged to lobby.

## 2019-02-07 NOTE — Progress Notes (Signed)
  [facility] Adult Case Management Discharge Plan :  Will you be returning to the same living situation after discharge:  No. Going to residential treatment. At discharge, do you have transportation home?: Yes,  facility providing treatment. Do you have the ability to pay for your medications: Yes,  [employer].  Release of information consent forms completed and in the chart.  Patient to Follow up at: [facility] on 02/07/2019.   Why: You have been accepted for residential substance use treatment. The facility will provide transportation.  Contact information: [place] [address] [phone]           Next level of care provider has access to [place] and Suicide Prevention discussed: Yes,  with sister, Elmo Putt  Have you used any form of tobacco in the last 30 days? (Cigarettes, Smokeless Tobacco, Cigars, and/or Pipes): Yes  Has patient been referred to the Quitline?: Patient refused referral  Patient has been referred for addiction treatment: Yes  Joellen Jersey, [place] 02/07/2019, 9:53 AM

## 2019-02-07 NOTE — Progress Notes (Signed)
Patient ID: Theodore Phelps, male   DOB: May 18, 1965, 53 y.o.   MRN: [id number]  [place] NOVEL CORONAVIRUS (COVID-19) DAILY CHECK-OFF SYMPTOMS - answer yes or no to each - every day NO YES  Have you had a fever in the past 24 hours?  . Fever (Temp > 37.80C / 100F) X   Have you had any of these symptoms in the past 24 hours? . New Cough .  Sore Throat  .  Shortness of Breath .  Difficulty Breathing .  Unexplained Body Aches   X   Have you had any one of these symptoms in the past 24 hours not related to allergies?   . Runny Nose .  Nasal Congestion .  Sneezing   X   If you have had runny nose, nasal congestion, sneezing in the past 24 hours, has it worsened?  X   EXPOSURES - check yes or no X   Have you traveled outside the state in the past 14 days?  X   Have you been in contact with someone with a confirmed diagnosis of COVID-19 or PUI in the past 14 days without wearing appropriate PPE?  X   Have you been living in the same home as a person with confirmed diagnosis of COVID-19 or a PUI (household contact)?    X   Have you been diagnosed with COVID-19?    X              What to do next: Answered NO to all: Answered YES to anything:   Proceed with unit schedule Follow the [facility] Inpatient Flowsheet.  1q

## 2019-02-07 NOTE — Care Management (Signed)
Patient has been accepted for treatment at [facility] for Wednesday, 10/21. Transportation will be provided by facility. Admissions will contact CMA when a date has been confirmed.    CMA will notify CSW.    Jhaniya Briski Care Management Assistant  Email:[email or web address] Office: [phone]

## 2019-02-07 NOTE — Plan of Care (Signed)
D: Pt denies SI/HI/AV hallucinations. Pt is pleasant and cooperative. Pt goal for today is to get sleep. A: Pt was offered support and encouragement. Pt was given scheduled medications. Pt was encourage to attend groups. Q 15 minute checks were done for safety.  R:Pt did not attend  groups and interacts well with staff. Pt is taking medication. Pt has no complaints.Pt receptive to treatment and safety maintained on unit.    Problem: Activity: Goal: Sleeping patterns will improve Outcome: Progressing

## 2019-02-07 NOTE — [facility] Group Notes (Signed)
LCSW Aftercare Discharge Planning Group Note  02/07/2019   Type of Group and Topic: Psychoeducational Group: Discharge Planning  Participation Level: Did Not Attend    Jillene Bucks  02/07/2019 2:29 PM

## 2019-02-07 NOTE — Care Management (Signed)
CMA spoke with Legrand Como in Admissions with [facility]. Transportation will pick up patient on Wednesday, 10/21 between 4:00p-5:00p. Driver's name is Kasandra Knudsen and he will Production designer, theatre/television/film when he arrives to [facility].    CMA will notify CSW, Stephanie Acre.     Tawfiq Favila Care Management Assistant  Email:[email or web address] Office: [phone]

## 2019-03-28 DIAGNOSIS — J0100 Acute maxillary sinusitis, unspecified: Secondary | ICD-10-CM | POA: Diagnosis not present

## 2019-03-28 DIAGNOSIS — Z20828 Contact with and (suspected) exposure to other viral communicable diseases: Secondary | ICD-10-CM | POA: Diagnosis not present

## 2019-03-28 DIAGNOSIS — R519 Headache, unspecified: Secondary | ICD-10-CM | POA: Diagnosis not present

## 2019-05-14 DIAGNOSIS — R0789 Other chest pain: Secondary | ICD-10-CM | POA: Diagnosis not present

## 2019-05-14 DIAGNOSIS — Z20822 Contact with and (suspected) exposure to covid-19: Secondary | ICD-10-CM | POA: Diagnosis not present

## 2019-05-14 DIAGNOSIS — R160 Hepatomegaly, not elsewhere classified: Secondary | ICD-10-CM | POA: Diagnosis not present

## 2019-05-14 DIAGNOSIS — R197 Diarrhea, unspecified: Secondary | ICD-10-CM | POA: Diagnosis not present

## 2019-05-14 DIAGNOSIS — R0602 Shortness of breath: Secondary | ICD-10-CM | POA: Diagnosis not present

## 2019-05-14 DIAGNOSIS — I10 Essential (primary) hypertension: Secondary | ICD-10-CM | POA: Diagnosis not present

## 2019-05-14 DIAGNOSIS — R112 Nausea with vomiting, unspecified: Secondary | ICD-10-CM | POA: Diagnosis not present

## 2019-05-14 DIAGNOSIS — K219 Gastro-esophageal reflux disease without esophagitis: Secondary | ICD-10-CM | POA: Diagnosis not present

## 2019-05-14 DIAGNOSIS — Z85528 Personal history of other malignant neoplasm of kidney: Secondary | ICD-10-CM | POA: Diagnosis not present

## 2019-05-14 DIAGNOSIS — F17210 Nicotine dependence, cigarettes, uncomplicated: Secondary | ICD-10-CM | POA: Diagnosis not present

## 2019-05-14 DIAGNOSIS — A419 Sepsis, unspecified organism: Secondary | ICD-10-CM | POA: Diagnosis not present

## 2019-05-14 DIAGNOSIS — Z905 Acquired absence of kidney: Secondary | ICD-10-CM | POA: Diagnosis not present

## 2019-05-14 DIAGNOSIS — Z79899 Other long term (current) drug therapy: Secondary | ICD-10-CM | POA: Diagnosis not present

## 2019-05-14 DIAGNOSIS — J111 Influenza due to unidentified influenza virus with other respiratory manifestations: Secondary | ICD-10-CM | POA: Diagnosis not present

## 2019-05-14 DIAGNOSIS — Z792 Long term (current) use of antibiotics: Secondary | ICD-10-CM | POA: Diagnosis not present

## 2019-05-14 DIAGNOSIS — I712 Thoracic aortic aneurysm, without rupture: Secondary | ICD-10-CM | POA: Diagnosis not present

## 2019-05-14 DIAGNOSIS — J189 Pneumonia, unspecified organism: Secondary | ICD-10-CM | POA: Diagnosis not present

## 2019-05-15 DIAGNOSIS — Z20822 Contact with and (suspected) exposure to covid-19: Secondary | ICD-10-CM | POA: Diagnosis not present

## 2019-05-15 DIAGNOSIS — I10 Essential (primary) hypertension: Secondary | ICD-10-CM | POA: Diagnosis not present

## 2019-05-15 DIAGNOSIS — R160 Hepatomegaly, not elsewhere classified: Secondary | ICD-10-CM | POA: Diagnosis not present

## 2019-05-15 DIAGNOSIS — I712 Thoracic aortic aneurysm, without rupture: Secondary | ICD-10-CM | POA: Diagnosis not present

## 2019-08-31 DIAGNOSIS — R634 Abnormal weight loss: Secondary | ICD-10-CM | POA: Diagnosis not present

## 2019-08-31 DIAGNOSIS — B029 Zoster without complications: Secondary | ICD-10-CM | POA: Diagnosis not present

## 2019-08-31 DIAGNOSIS — L02414 Cutaneous abscess of left upper limb: Secondary | ICD-10-CM | POA: Diagnosis not present

## 2019-08-31 DIAGNOSIS — B1920 Unspecified viral hepatitis C without hepatic coma: Secondary | ICD-10-CM | POA: Diagnosis not present

## 2019-08-31 DIAGNOSIS — I10 Essential (primary) hypertension: Secondary | ICD-10-CM | POA: Diagnosis not present

## 2019-08-31 DIAGNOSIS — K219 Gastro-esophageal reflux disease without esophagitis: Secondary | ICD-10-CM | POA: Diagnosis not present

## 2020-10-01 IMAGING — DX DG CHEST 2V
2 series · 2 of 2 positions shown · non-contrast
Comparison: 05/06/2015

CLINICAL DATA: Chest pain short of breath

EXAM:
CHEST - 2 VIEW

[w chest pa]
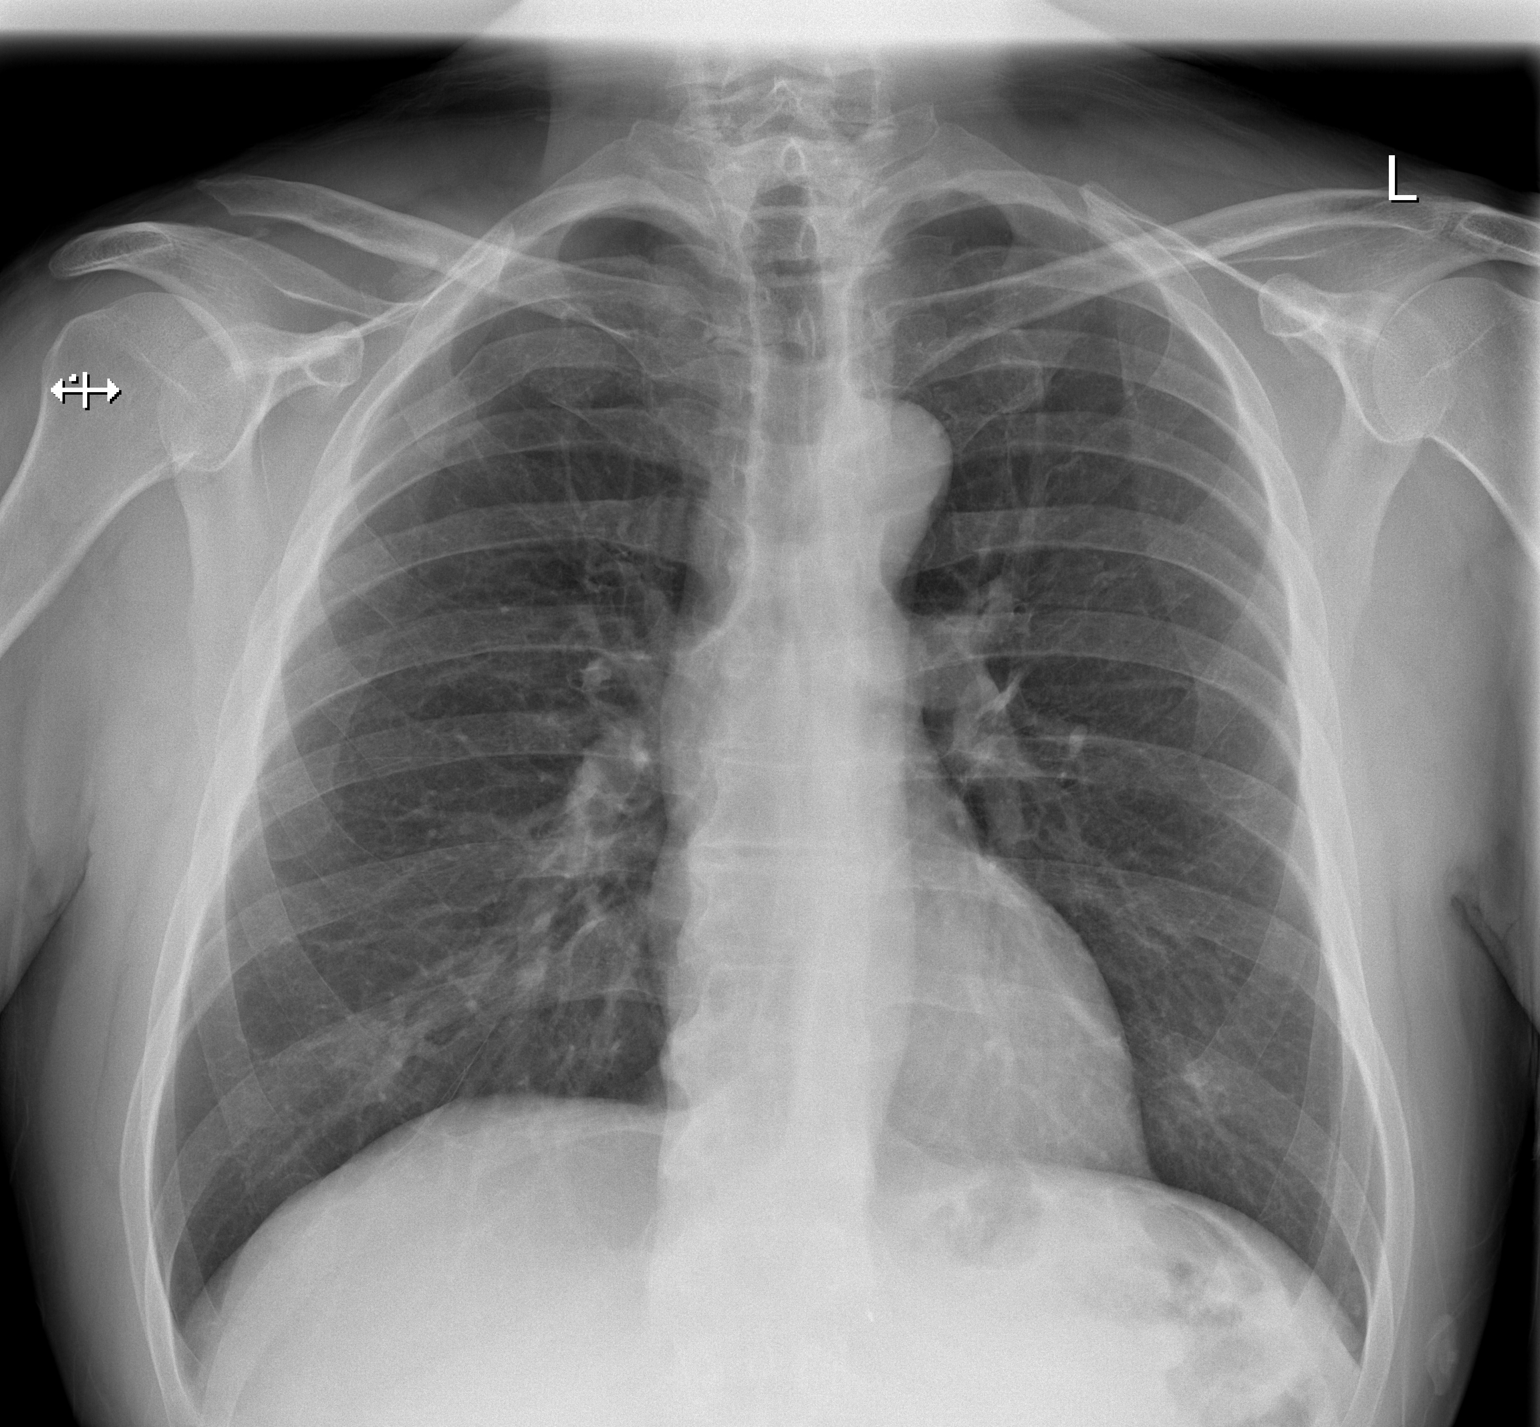

[w chest lat]
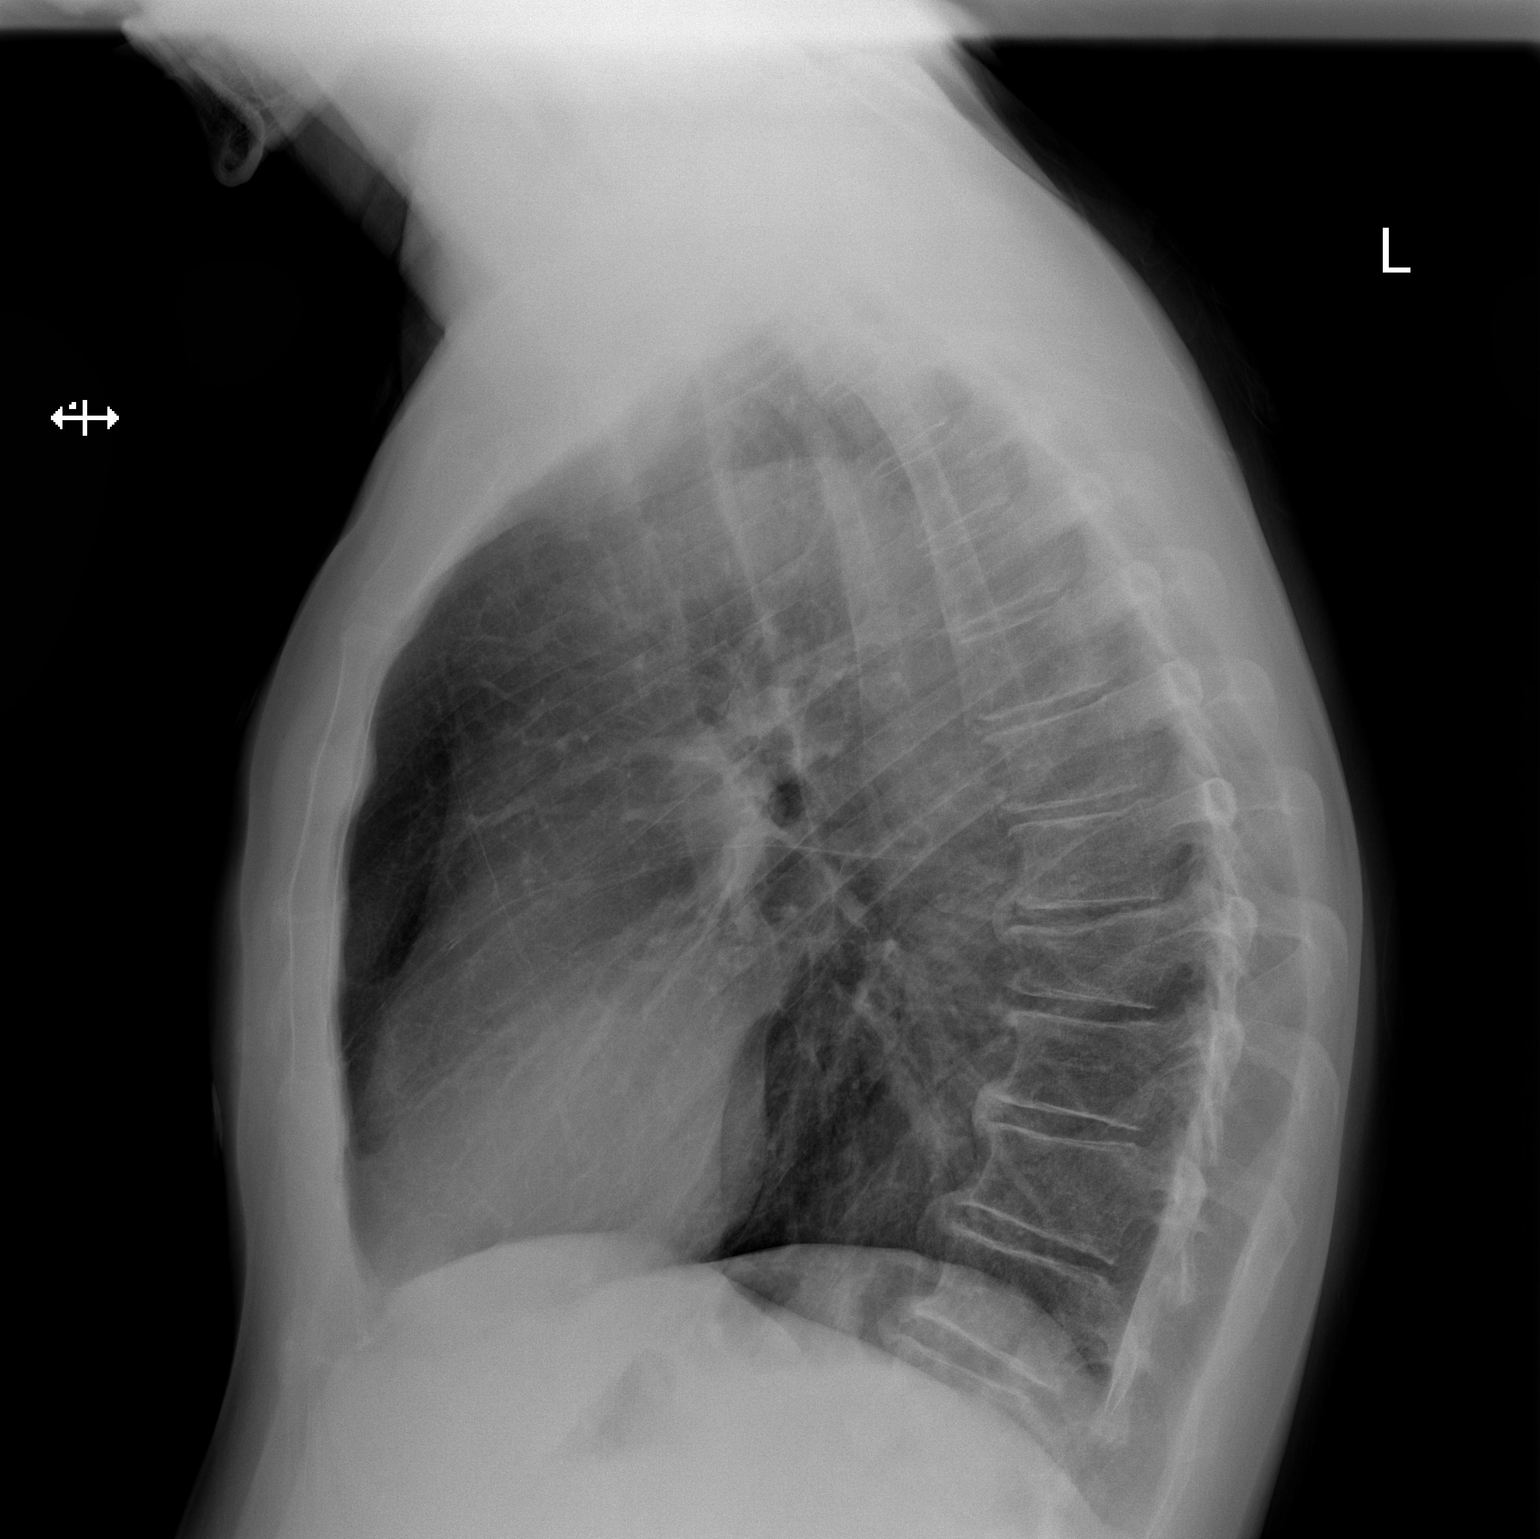

[2 of 2 positions shown; findings below may reference images not displayed]

FINDINGS: Cardiac and mediastinal contours normal. Lungs are clear. Chronic
right posterior rib fracture fourth rib.
IMPRESSION: No active cardiopulmonary disease.
